# Patient Record
Sex: Female | Born: 1975
Health system: Southern US, Community
[De-identification: ages and names within clinical notes are randomized; demographics above are authoritative.]

## PROBLEM LIST (undated history)

## (undated) DIAGNOSIS — M1990 Unspecified osteoarthritis, unspecified site: Secondary | ICD-10-CM

## (undated) DIAGNOSIS — I10 Essential (primary) hypertension: Secondary | ICD-10-CM

## (undated) DIAGNOSIS — E785 Hyperlipidemia, unspecified: Secondary | ICD-10-CM

## (undated) HISTORY — DX: Hyperlipidemia, unspecified: E78.5

## (undated) HISTORY — DX: Unspecified osteoarthritis, unspecified site: M19.90

## (undated) HISTORY — PX: LAPAROSCOPY: SHX197

## (undated) HISTORY — DX: Essential (primary) hypertension: I10

---

## 2002-09-05 ENCOUNTER — Other Ambulatory Visit: Admission: RE | Admit: 2002-09-05 | Discharge: 2002-09-05 | Payer: Self-pay | Admitting: Family Medicine

## 2002-12-11 ENCOUNTER — Other Ambulatory Visit: Admission: RE | Admit: 2002-12-11 | Discharge: 2002-12-11 | Payer: Self-pay | Admitting: Family Medicine

## 2003-06-26 ENCOUNTER — Other Ambulatory Visit: Admission: RE | Admit: 2003-06-26 | Discharge: 2003-06-26 | Payer: Self-pay | Admitting: Gastroenterology

## 2004-11-18 ENCOUNTER — Other Ambulatory Visit: Admission: RE | Admit: 2004-11-18 | Discharge: 2004-11-18 | Payer: Self-pay | Admitting: Family Medicine

## 2005-06-21 ENCOUNTER — Ambulatory Visit (HOSPITAL_BASED_OUTPATIENT_CLINIC_OR_DEPARTMENT_OTHER): Admission: RE | Admit: 2005-06-21 | Discharge: 2005-06-21 | Payer: Self-pay | Admitting: Gynecology

## 2005-06-30 HISTORY — PX: LAPAROSCOPY: SHX197

## 2005-09-22 ENCOUNTER — Other Ambulatory Visit: Admission: RE | Admit: 2005-09-22 | Discharge: 2005-09-22 | Payer: Self-pay | Admitting: Gynecology

## 2006-04-21 ENCOUNTER — Inpatient Hospital Stay (HOSPITAL_COMMUNITY): Admission: AD | Admit: 2006-04-21 | Discharge: 2006-04-25 | Payer: Self-pay | Admitting: Gynecology

## 2006-04-22 ENCOUNTER — Encounter (INDEPENDENT_AMBULATORY_CARE_PROVIDER_SITE_OTHER): Payer: Self-pay | Admitting: Specialist

## 2006-06-02 ENCOUNTER — Other Ambulatory Visit: Admission: RE | Admit: 2006-06-02 | Discharge: 2006-06-02 | Payer: Self-pay | Admitting: Gynecology

## 2007-10-25 ENCOUNTER — Other Ambulatory Visit: Admission: RE | Admit: 2007-10-25 | Discharge: 2007-10-25 | Payer: Self-pay | Admitting: Gynecology

## 2008-05-16 ENCOUNTER — Ambulatory Visit: Payer: Self-pay | Admitting: Gynecology

## 2008-05-16 ENCOUNTER — Encounter: Payer: Self-pay | Admitting: Gynecology

## 2008-10-31 ENCOUNTER — Other Ambulatory Visit: Admission: RE | Admit: 2008-10-31 | Discharge: 2008-10-31 | Payer: Self-pay | Admitting: Gynecology

## 2008-10-31 ENCOUNTER — Encounter: Payer: Self-pay | Admitting: Gynecology

## 2008-10-31 ENCOUNTER — Ambulatory Visit: Payer: Self-pay | Admitting: Gynecology

## 2008-11-11 ENCOUNTER — Ambulatory Visit: Payer: Self-pay | Admitting: Gynecology

## 2009-07-22 ENCOUNTER — Ambulatory Visit: Payer: Self-pay | Admitting: Gynecology

## 2009-07-29 ENCOUNTER — Ambulatory Visit: Payer: Self-pay | Admitting: Gynecology

## 2009-08-08 ENCOUNTER — Ambulatory Visit: Payer: Self-pay | Admitting: Gynecology

## 2009-11-25 ENCOUNTER — Ambulatory Visit (HOSPITAL_COMMUNITY): Admission: RE | Admit: 2009-11-25 | Discharge: 2009-11-25 | Payer: Self-pay | Admitting: Obstetrics & Gynecology

## 2010-03-20 ENCOUNTER — Inpatient Hospital Stay (HOSPITAL_COMMUNITY): Admission: AD | Admit: 2010-03-20 | Discharge: 2010-03-23 | Payer: Self-pay | Admitting: Obstetrics & Gynecology

## 2010-03-20 ENCOUNTER — Encounter (INDEPENDENT_AMBULATORY_CARE_PROVIDER_SITE_OTHER): Payer: Self-pay | Admitting: Obstetrics & Gynecology

## 2010-11-14 LAB — MRSA CULTURE

## 2010-11-14 LAB — CBC
HCT: 35.6 % — ABNORMAL LOW (ref 36.0–46.0)
Hemoglobin: 10.5 g/dL — ABNORMAL LOW (ref 12.0–15.0)
MCH: 29.8 pg (ref 26.0–34.0)
MCH: 30.3 pg (ref 26.0–34.0)
MCHC: 34.2 g/dL (ref 30.0–36.0)
MCHC: 34.2 g/dL (ref 30.0–36.0)
MCV: 88.6 fL (ref 78.0–100.0)
MCV: 88.8 fL (ref 78.0–100.0)
MCV: 88.8 fL (ref 78.0–100.0)
Platelets: 189 10*3/uL (ref 150–400)
Platelets: 192 10*3/uL (ref 150–400)
RDW: 13.8 % (ref 11.5–15.5)
RDW: 14 % (ref 11.5–15.5)
WBC: 9.7 10*3/uL (ref 4.0–10.5)

## 2010-11-14 LAB — SURGICAL PCR SCREEN: Staphylococcus aureus: INVALID — AB

## 2010-11-14 LAB — RPR
RPR Ser Ql: NONREACTIVE
RPR Ser Ql: NONREACTIVE

## 2011-01-15 NOTE — Op Note (Signed)
NAME:  Pamela Dawson, Pamela Dawson                 ACCOUNT NO.:  [id number]   MEDICAL RECORD NO.:  [id number]          PATIENT TYPE:  AMB   LOCATION:  [facility]                         FACILITY:  [facility]   PHYSICIAN:  Timothy P. Fontaine, M.D.DATE OF BIRTH:  1975/09/16   DATE OF PROCEDURE:  06/21/2005  DATE OF DISCHARGE:  06/21/2005                                 OPERATIVE REPORT   PREOPERATIVE DIAGNOSIS:  Pelvic pain.   POSTOPERATIVE DIAGNOSIS:  Pelvic pain.   PROCEDURE:  Diagnostic laparoscopy with chromopertubation.   SURGEON:  Timothy P. Fontaine, M.D.   ANESTHETIC:  General.   ESTIMATED BLOOD LOSS:  Minimal.   COMPLICATIONS:  None.   SPECIMEN:  None.   FINDINGS:  Anterior cul-de-sac normal, posterior cul-de-sac normal, uterus  normal size, shape and contour. Right and left fallopian tubes normal  length, caliber fimbriated ends, positive fill and spill of dye. Right and  left ovaries grossly normal, free and mobile. No evidence of endometriosis  or pelvic adhesive disease. Upper abdominal exam is normal. Appendix not  visualized due to omental obscuring, liver is smooth, no abnormalities or  perihepatic adhesions. Gallbladder grossly normal to limited inspection.   PROCEDURE:  The patient was taken to the operating room, underwent general  anesthesia and was placed in low dorsal lithotomy position, received an  abdominal perineal vaginal preparation with Betadine solution, bladder  emptied with in-and-out Foley catheterization. EUA performed and a Cohen  cannula was placed on the cervix with a single-tooth tenaculum  stabilization. The patient was draped in the usual fashion. A vertical  infraumbilical incision was made. The Veress needle was placed without  difficulty, its position verified with water. Approximately 2.5 liters of  carbon dioxide gas was infused without difficulty and subsequently the 10 mm  laparoscopic trocar was placed without difficulty, its position verified  visually. A 5 mm suprapubic port was then placed under direct visualization  after transillumination for the vessels without difficulty and examination  of the pelvic organs and upper abdominal exam was carried out with findings  noted above. The thigh was then injected transcervically, there was positive  fill and spill bilaterally. Subsequently the gas was allowed to escape, the  suprapubic port removed, adequate hemostasis visualized at the port site.  The infraumbilical port was then backed out under direct visualization  without showing adequate hemostasis and no evidence of hernia formation. The  infraumbilical port was then closed using #0 Vicryl suture in an interrupted  subcutaneous fascial stitch. Both skin incisions were injected using 0.25%  Marcaine  and both skin incisions closed using Dermabond skin adhesive. The single-  tooth tenaculum and Cohen cannula were removed from the cervix. The patient  placed in the supine position, awakened without difficulty and taken to the  recovery room in good condition having tolerated the procedure well.      Timothy P. Fontaine, M.D.  Electronically Signed     TPF/MEDQ  D:  06/23/2005  T:  06/23/2005  Job:  [id number]

## 2011-01-15 NOTE — Discharge Summary (Signed)
NAME:  Pamela Dawson, Pamela Dawson                 ACCOUNT NO.:  [id number]   MEDICAL RECORD NO.:  [id number]          PATIENT TYPE:  INP   LOCATION:  9103                          FACILITY:  [facility]   PHYSICIAN:  Timothy P. Fontaine, M.D.DATE OF BIRTH:  December 13, 1975   DATE OF ADMISSION:  04/21/2006  DATE OF DISCHARGE:  04/25/2006                                 DISCHARGE SUMMARY   DISCHARGE DIAGNOSES:  1. Pregnancy at term.  2. Pregnancy-induced hypertension.  3. Nonreassuring fetal tracing.   PROCEDURE:  Primary low transverse cervical cesarean section April 22, 2006, Dr. Colin Broach.   HOSPITAL COURSE:  A 35 year old G1 P0 female admitted for induction due to  [facility] term pregnancy.  The patient underwent Cervidil induction in the p.m.  with Pitocin in the a.m. and was noted to have thick meconium upon rupture  of membranes and was placed on an amnio infusion.  The patient was continued  on her Pitocin induction and on reassessment later that day, was found to  have no change in her cervical exam noting an unfavorable station of -3 and  with a blunted fetal tracing showing no accelerations, although noting good  beat-to-beat and no decelerations.  The situation was reviewed with the  patient and her husband and given the unlikelihood of vaginal delivery and  even the most optimal circumstances not for a number of hours now with  blunted fetal tracing, it felt most prudent to proceed with cesarean  section.  The patient underwent an uncomplicated primary low transverse  cervical cesarean section April 22, 2006, producing a normal female infant,  Apgars 9 and 9, weight 7 pounds 8 ounces.  The patient's postoperative  course was uncomplicated.  She was discharged on postoperative day #3,  ambulating well, tolerating a regular diet with a postoperative hemoglobin  of 10.5, blood type A positive, rubella titer positive.  Her blood pressures  fluctuated, the 2 most recent at 132/88, 115/72.  The  patient received  precautions, instructions, and follow-up.  We will monitor her blood  pressure at present status, as anticipating we will see this normalized over  the next several weeks.  She will be seen in the office in 6 weeks following  delivery and receives a prescription for Tylox #30 1-2 p.o. q. 4-6 hours  p.r.n. pain.      Timothy P. Fontaine, M.D.  Electronically Signed     TPF/MEDQ  D:  04/25/2006  T:  04/25/2006  Job:  [id number]

## 2011-01-15 NOTE — H&P (Signed)
NAME:  OAKLEY, ORBAN                 ACCOUNT NO.:  [id number]   MEDICAL RECORD NO.:  [id number]          PATIENT TYPE:  AMB   LOCATION:  [facility]                         FACILITY:  [facility]   PHYSICIAN:  Timothy P. Fontaine, M.D.DATE OF BIRTH:  23-Nov-1975   DATE OF ADMISSION:  06/21/2005  DATE OF DISCHARGE:                                HISTORY & PHYSICAL   CHIEF COMPLAINT:  Pelvic pain.   HISTORY OF PRESENT ILLNESS:  A 35 year old, G0 female with a history of  lower abdominopelvic pain. The patient notes bloating, sharp, stabbing to  crampy type pain that comes and goes getting worse. Outpatient evaluation  includes a normal exam with ultrasound showing a normal appearing uterus,  endometrial cavity, right to left ovaries. GC chlamydia screen were done and  normal. The patient and her husband have been attempting pregnancy over the  past year without success and she is being admitted at this time for  diagnostic laser laparoscopy chromopertubation.   PAST MEDICAL HISTORY:  None.   PAST SURGICAL HISTORY:  None.   ALLERGIES:  DEPAKOTE.   CURRENT MEDICATIONS:  Vitamins.   REVIEW OF SYSTEMS:  Noncontributory.   SOCIAL HISTORY:  Noncontributory.   FAMILY HISTORY:  Noncontributory.   PHYSICAL EXAM:  VITAL SIGNS:  Afebrile. Vital signs are stable.  HEENT:  Normal.  LUNGS:  Clear.  CARDIAC:  Regular rate. No rubs, murmurs or gallops.  ABDOMEN:  Benign.  PELVIC:  External, BUS, vagina normal. Cervix normal. Uterus normal size,  midline, and mobile. Adnexa without masses or tenderness.   ASSESSMENT:  A 35 year old, G0  with increasing pelvic pain whose been  attempting pregnancy over the past year without success. Evaluation includes  normal exam, normal ultrasound, negative GC chlamydia screen, a normal TSH,  FSH and prolactin. The patient is admitted at this time for laser  laparoscopy and chromopertubation. I reviewed with the patient and her  husband the expected intraoperative,  postoperative courses with review of  instrumentation, insufflation, trocar placement multiple port sites, sharp  blunt dissection, electrocautery and the use of laser. The potential  pathologic findings were reviewed to include a normal pelvis, adhesive  disease, endometriosis was all discussed with her. She understands I will  try to eradicate disease as I feel safe to do at that time and if no disease  is encountered, then obviously we will terminate the procedure at that time.  No guarantees as far as pain relief or pregnancy following the procedure  were made and the patient understands this. The acute intraoperative,  postoperative risks were discussed to include the risk of infection both  internal requiring prolonged antibiotics as well as incisional requiring  opening and draining of incisions. The risks of inadvertent injury to major  vessels leading to hemorrhage necessitating transfusion and the risks of  transfusion including transfusion reaction, hepatitis, HIV, mad cow disease  and other unknown entities was all discussed, understood, and accepted. The  risks of inadvertent injury to internal organs including bowel, bladder,  ureters, vessels and nerves necessitating major exploratory reparative  surgeries including future reparative surgeries including  ostomy formation  was all discussed, understood and accepted. The issues of immediate  recognition versus delayed recognition was also discussed and the  postoperative expected to signs and symptoms reviewed to alert her to call  me with any issues. The patient's questions were answered to her  satisfaction and she is ready to proceed with surgery.      Timothy P. Fontaine, M.D.  Electronically Signed     TPF/MEDQ  D:  06/15/2005  T:  06/15/2005  Job:  [id number]

## 2011-01-15 NOTE — Op Note (Signed)
NAME:  Pamela Dawson, Pamela Dawson                 ACCOUNT NO.:  [id number]   MEDICAL RECORD NO.:  [id number]          PATIENT TYPE:  INP   LOCATION:  9103                          FACILITY:  [facility]   PHYSICIAN:  Timothy P. Fontaine, M.D.DATE OF BIRTH:  06/16/1976   DATE OF PROCEDURE:  04/22/2006  DATE OF DISCHARGE:                                 OPERATIVE REPORT   PREOPERATIVE DIAGNOSES:  1. Pregnancy at term.  2. Nonreassuring fetal heart rate tracing   POSTOPERATIVE DIAGNOSIS:  1. Pregnancy at term.  2. Nonreassuring fetal heart rate tracing   PROCEDURE:  Primary low transverse cervical cesarean section.   SURGEON:  Timothy P. Fontaine, M.D.   ASSISTANT:  Scrub technician.   ANESTHESIA:  Epidural.   ESTIMATED BLOOD LOSS:  Less than 500 mL.   COMPLICATIONS:  None.   SPECIMEN:  1. Samples of cord blood.  2. Cord blood banking.  3. Placenta and umbilical cord.   FINDINGS:  At 56, normal female, Apgars 9/9, weight 7 pounds 8 ounces,  nuchal cord x1.  Pelvic anatomy noted to be normal.   PROCEDURE:  The patient was taken to the operating room, had her epidural  catheter dosed, and was placed in the left tilt supine position, received  abdominal preparation with Betadine solution.  The patient was draped in the  usual fashion having had a Foley catheter placed on labor and delivery.  After assuring adequate anesthesia, the abdomen was sharply entered through  a Pfannenstiel incision achieving adequate hemostasis at all levels.  The  bladder flap was sharply and bluntly developed without difficulty.  The  uterus was sharply entered in the lower uterine segment and bluntly extended  laterally.  The membranes were ruptured, the fluid noted to be light  meconium stained. The infant's head was delivered through the incision.  A  DeLee suction was used to clear the nares and oropharynx. Subsequently, the  bulb suction was used.  A nuchal cord x1 was reduced.  The rest of the  infant was  delivered, the cord doubly clamped and cut, and the infant handed  to pediatrics in attendance.  Samples of cord blood were obtained.  The  placenta was then spontaneously extruded and handed off for cord blood  banking collection.  The placenta was then spontaneously extruded and sent  to pathology.  The uterus was exteriorized, endometrial cavity explored with  a sponge to remove all placental and membrane fragments.  The patient  received antibiotic prophylaxis at this time.  The uterine incision was then  closed in two layers using 0 Vicryl suture, first in a running interlocking  stitch followed by an imbricating stitch.  The uterus was then returned to  the abdomen which was copiously irrigated.  Adequate hemostasis was  visualized.  The anterior fascia reapproximated using 0 Vicryl suture in a  running stitch.  The subcutaneous tissues were irrigated.  Adequate  hemostasis was achieved with electrocautery.  The skin was reapproximated  using 4-0 Vicryl in a running subcuticular stitch.  Benzoin and Steri-Strips  applied.  Sterile dressing applied.  The  patient was taken to the recovery  room in good condition having tolerated the procedure well.      Timothy P. Fontaine, M.D.  Electronically Signed     TPF/MEDQ  D:  04/22/2006  T:  04/23/2006  Job:  [id number]

## 2011-01-15 NOTE — Consult Note (Signed)
NAME:  Pamela Dawson, Pamela Dawson                 ACCOUNT NO.:  [id number]   MEDICAL RECORD NO.:  [id number]          PATIENT TYPE:  INP   LOCATION:  9167                          FACILITY:  [facility]   PHYSICIAN:  Timothy P. Fontaine, M.D.DATE OF BIRTH:  [birth year]   DATE OF CONSULTATION:  04/22/2006  DATE OF DISCHARGE:                                   CONSULTATION   In to evaluate patient in labor.  The patient had thick meconium on rupture  of membranes earlier this morning by Dr. Farrel Gobble.  Had an IUPC placed, had  an amnioinfusion, then was begun on Pitocin augmentation.  Subsequently the  Pitocin had been discontinued as she was in active labor spontaneously.  Subsequently had epidural placed due to pain and currently is starting to  feel more pain, requesting redose of her epidural.  Her external monitor  shows regular contractions every 1-3 minutes, and her fetal tracing shows a  blunted, reassuring tracing with beat-to-beat, no clear accelerations, no  decelerations.  Baseline is 130s-140s.  On pelvic, cervix is 2 cm, 40%  effaced, -3 station.  No evidence of gross meconium on vaginal glove  examination.  I discussed with the patient no cervix change since this  morning.  Given the unfavorable station, very high, unengaged, and a blunted  fetal tracing, I feel the most prudent choice is to move to cesarean  section.  No confirmed evidence of fetal distress but doubting that this  patient will progress to a spontaneous vaginal delivery.  I do not think  that proceeding with continued attempts at vaginal delivery and prolonged  observation is prudent.  I reviewed what is involved with cesarean section,  intraoperative, postoperative courses, and the risks, to include infection  both internal requiring prolonged antibiotics and incisional requiring  opening and draining of the incision and closure by secondary intention; the  risks of hemorrhage necessitating transfusion and the risks of transfusion  were reviewed.  The risk of an inadvertent injury to internal organs  including bowel, bladder, ureters, vessels and nerves necessitating major  exploratory or reparative surgeries and future reparative surgeries  including ostomy formation, was all discussed, understood and accepted.  Lastly, the risk of fetal injury, musculoskeletal, neural, scalpel injuries  during the birthing process, was all discussed, understood and accepted.  The patient's questions were answered to her satisfaction and she is ready  to proceed with surgery.      Timothy P. Fontaine, M.D.  Electronically Signed     TPF/MEDQ  D:  04/22/2006  T:  04/22/2006  Job:  [id number]

## 2011-01-15 NOTE — H&P (Signed)
NAME:  Pamela Dawson, Pamela Dawson                 ACCOUNT NO.:  [id number]   MEDICAL RECORD NO.:  [id number]          PATIENT TYPE:  MAT   LOCATION:  [facility]                          FACILITY:  [facility]   PHYSICIAN:  Timothy P. Fontaine, M.D.DATE OF BIRTH:  11/26/1975   DATE OF ADMISSION:  04/21/2006  DATE OF DISCHARGE:                                HISTORY & PHYSICAL   CHIEF COMPLAINT:  1. Pregnancy at term.  2. Pregnancy-induced hypertension.   HISTORY OF PRESENT ILLNESS:  A 35 year old G1, P0, female at 71 weeks'  gestation, admitted for induction due to favorable cervix and PIH.  The  patient has been followed with antepartum to include NSTs and AFIs, all of  which have been normal.  Her registration blood pressure was 118/76.  Her  most recent office visit blood pressure is 130/90, negative proteinuria.  The risks, benefits, indications and alternatives for induction were  reviewed with the patient and the husband, and they agreed.  For the  remainder of her history, see her [place].   PHYSICAL EXAMINATION:  HEENT:  Normal.  LUNGS:  Clear.  CARDIAC:  Regular rate.  No rubs, murmurs or gallops.  ABDOMEN:  Gravid, vertex fetus, consistent with term, reactive NST.  PELVIC:  Cervix loose fingertip, 50%, -2 station, vertex presentation.   ASSESSMENT:  A 35 year old G1, P0, female at 43 weeks' gestation, mild  pregnancy-induced hypertension at term.  Cervix favorable for induction.  Will plan on Cervidil in the p.m., Pitocin in the a.m.  She is beta strep-  negative.  The plan was discussed with the patient and her husband, both of  whom agreed.      Timothy P. Fontaine, M.D.  Electronically Signed     TPF/MEDQ  D:  04/20/2006  T:  04/20/2006  Job:  [id number]

## 2011-12-16 ENCOUNTER — Emergency Department (HOSPITAL_BASED_OUTPATIENT_CLINIC_OR_DEPARTMENT_OTHER)
Admission: EM | Admit: 2011-12-16 | Discharge: 2011-12-16 | Disposition: A | Discharge status: Home / Self Care | Payer: Worker's Compensation | Attending: Emergency Medicine | Admitting: Emergency Medicine

## 2011-12-16 ENCOUNTER — Emergency Department (INDEPENDENT_AMBULATORY_CARE_PROVIDER_SITE_OTHER): Payer: Worker's Compensation

## 2011-12-16 ENCOUNTER — Encounter (HOSPITAL_BASED_OUTPATIENT_CLINIC_OR_DEPARTMENT_OTHER): Payer: Self-pay | Admitting: Emergency Medicine

## 2011-12-16 DIAGNOSIS — M25476 Effusion, unspecified foot: Secondary | ICD-10-CM | POA: Insufficient documentation

## 2011-12-16 DIAGNOSIS — M25473 Effusion, unspecified ankle: Secondary | ICD-10-CM | POA: Insufficient documentation

## 2011-12-16 DIAGNOSIS — R209 Unspecified disturbances of skin sensation: Secondary | ICD-10-CM | POA: Insufficient documentation

## 2011-12-16 DIAGNOSIS — X500XXA Overexertion from strenuous movement or load, initial encounter: Secondary | ICD-10-CM

## 2011-12-16 DIAGNOSIS — S93409A Sprain of unspecified ligament of unspecified ankle, initial encounter: Secondary | ICD-10-CM | POA: Insufficient documentation

## 2011-12-16 DIAGNOSIS — M25579 Pain in unspecified ankle and joints of unspecified foot: Secondary | ICD-10-CM | POA: Insufficient documentation

## 2011-12-16 DIAGNOSIS — S93402A Sprain of unspecified ligament of left ankle, initial encounter: Secondary | ICD-10-CM

## 2011-12-16 DIAGNOSIS — W010XXA Fall on same level from slipping, tripping and stumbling without subsequent striking against object, initial encounter: Secondary | ICD-10-CM | POA: Insufficient documentation

## 2011-12-16 MED ORDER — OXYCODONE-ACETAMINOPHEN 5-325 MG PO TABS
1.0000 | ORAL_TABLET | Freq: Once | ORAL | Status: AC
  Administered 2011-12-16: 1 via ORAL
  Filled 2011-12-16: qty 1

## 2011-12-16 MED ORDER — OXYCODONE-ACETAMINOPHEN 5-325 MG PO TABS: 1.0000 | ORAL_TABLET | ORAL | Status: AC | PRN

## 2011-12-16 NOTE — ED Notes (Signed)
Pt was at work - running on grassy surface.  Stepped into hole, twisted left ankle and fell.  Ice applied in triage. CMS intact.

## 2011-12-16 NOTE — ED Notes (Signed)
MD at bedside. 

## 2011-12-16 NOTE — Discharge Instructions (Signed)
Ankle Sprain An ankle sprain is an injury to the strong, fibrous tissues (ligaments) that hold the bones of your ankle joint together.  CAUSES Ankle sprain usually is caused by a fall or by twisting your ankle. People who participate in sports are more prone to these types of injuries.  SYMPTOMS  Symptoms of ankle sprain include:  Pain in your ankle. The pain may be present at rest or only when you are trying to stand or walk.   Swelling.   Bruising. Bruising may develop immediately or within 1 to 2 days after your injury.   Difficulty standing or walking.  DIAGNOSIS  Your caregiver will ask you details about your injury and perform a physical exam of your ankle to determine if you have an ankle sprain. During the physical exam, your caregiver will press and squeeze specific areas of your foot and ankle. Your caregiver will try to move your ankle in certain ways. An X-ray exam may be done to be sure a bone was not broken or a ligament did not separate from one of the bones in your ankle (avulsion).  TREATMENT  Certain types of braces can help stabilize your ankle. Your caregiver can make a recommendation for this. Your caregiver may recommend the use of medication for pain. If your sprain is severe, your caregiver may refer you to a surgeon who helps to restore function to parts of your skeletal system (orthopedist) or a physical therapist. HOME CARE INSTRUCTIONS  Apply ice to your injury for 1 to 2 days or as directed by your caregiver. Applying ice helps to reduce inflammation and pain.  Put ice in a plastic bag.   Place a towel between your skin and the bag.   Leave the ice on for 15 to 20 minutes at a time, every 2 hours while you are awake.   Take over-the-counter or prescription medicines for pain, discomfort, or fever only as directed by your caregiver.   Keep your injured leg elevated, when possible, to lessen swelling.   If your caregiver recommends crutches, use them as  instructed. Gradually, put weight on the affected ankle. Continue to use crutches or a cane until you can walk without feeling pain in your ankle.   If you have a plaster splint, wear the splint as directed by your caregiver. Do not rest it on anything harder than a pillow the first 24 hours. Do not put weight on it. Do not get it wet. You may take it off to take a shower or bath.   You may have been given an elastic bandage to wear around your ankle to provide support. If the elastic bandage is too tight (you have numbness or tingling in your foot or your foot becomes cold and blue), adjust the bandage to make it comfortable.   If you have an air splint, you may blow more air into it or let air out to make it more comfortable. You may take your splint off at night and before taking a shower or bath.   Wiggle your toes in the splint several times per day if you are able.  SEEK MEDICAL CARE IF:   You have an increase in bruising, swelling, or pain.   Your toes feel cold.   Pain relief is not achieved with medication.  SEEK IMMEDIATE MEDICAL CARE IF: Your toes are numb or blue or you have severe pain. MAKE SURE YOU:   Understand these instructions.   Will watch your condition.     Will get help right away if you are not doing well or get worse.  Document Released: 08/16/2005 Document Revised: 08/05/2011 Document Reviewed: 03/20/2008 [employer] Patient Information 2012 [employer]. 

## 2011-12-16 NOTE — ED Provider Notes (Signed)
History     CSN: [id number]  Arrival date & time 12/16/11  4098   First MD Initiated Contact with Patient 12/16/11 1000      Chief Complaint  Patient presents with  . Ankle Pain     Patient is a 36 y.o. female presenting with ankle pain. The history is provided by the patient.  Ankle Pain  The incident occurred 1 to 2 hours ago. The incident occurred at work. The injury mechanism was a fall. The pain is present in the left ankle. The pain is moderate. The pain has been constant since onset. Associated symptoms include numbness and inability to bear weight. Pertinent negatives include no loss of motion and no muscle weakness. The symptoms are aggravated by activity, bearing weight and palpation. She has tried rest for the symptoms. The treatment provided mild relief.  pt was running in grass, stepped in hole and twisted left ankle, heard a "crack" No head trauma No other injuries reported   PMH - none  Past Surgical History  Procedure Date  . Cesarean section   . Laparoscopy     No family history on file.  History  Substance Use Topics  . Smoking status: Never Smoker   . Smokeless tobacco: Not on file  . Alcohol Use: No    OB History    Grav Para Term Preterm Abortions TAB SAB Ect Mult Living                  Review of Systems  Musculoskeletal: Positive for joint swelling.  Neurological: Positive for numbness.    Allergies  Depakote  Home Medications   Current Outpatient Rx  Name Route Sig Dispense Refill  . OXYCODONE-ACETAMINOPHEN 5-325 MG PO TABS Oral Take 1 tablet by mouth every 4 (four) hours as needed for pain. 15 tablet 0    BP 121/81  Pulse 61  Temp(Src) 98.2 F (36.8 C) (Oral)  Resp 16  Ht 5\' 4"  (1.626 m)  Wt 185 lb (83.915 kg)  BMI 31.76 kg/m2  SpO2 100%  LMP 11/22/2011  Physical Exam CONSTITUTIONAL: Well developed/well nourished HEAD AND FACE: Normocephalic/atraumatic EYES: EOMI/PERRL ENMT: Mucous membranes moist NECK: supple no  meningeal signs SPINE:entire spine nontender CV: S1/S2 noted, no murmurs/rubs/gallops noted LUNGS: Lungs are clear to auscultation bilaterally, no apparent distress ABDOMEN: soft, nontender, no rebound or guarding GU:no cva tenderness NEURO: Pt is awake/alert, moves all extremitiesx4 Distal motor intact on left foot EXTREMITIES: pulses normal, full ROM Tenderness to left lateral/medial malleolus but no deformity.  Distal foot nontender to palpation.  She is able to move all toes.  Left achilles intact.  No left prox fib tenderness, full ROM of left without difficulty Able to range left hip without difficulty SKIN: warm, color normal PSYCH: no abnormalities of mood noted  ED Course  Procedures   Labs Reviewed - No data to display Dg Ankle Complete Left  12/16/2011  *RADIOLOGY REPORT*  Clinical Data: Turned ankle running, pain, cannot dorsiflex  LEFT ANKLE COMPLETE - 3+ VIEW  Comparison: Left foot radiographs 06/18/2009  Findings: Osseous mineralization normal. Ankle mortise intact. No acute fracture, dislocation or bone destruction.  IMPRESSION: No acute osseous abnormalities.  Original Report Authenticated By: Lollie Marrow, M.D.     1. Left ankle sprain       MDM  Nursing notes reviewed and considered in documentation xrays reviewed and considered  Will advise NWB, air splint and f/u in 5 days Restrictions for work given  The patient  appears reasonably screened and/or stabilized for discharge and I doubt any other medical condition or other [facility] requiring further screening, evaluation, or treatment in the ED at this time prior to discharge.         Joya Gaskins, MD 12/16/11 1122

## 2016-08-16 ENCOUNTER — Other Ambulatory Visit (HOSPITAL_BASED_OUTPATIENT_CLINIC_OR_DEPARTMENT_OTHER): Payer: Self-pay | Admitting: Family Medicine

## 2016-08-16 ENCOUNTER — Ambulatory Visit (HOSPITAL_BASED_OUTPATIENT_CLINIC_OR_DEPARTMENT_OTHER)
Admission: RE | Admit: 2016-08-16 | Discharge: 2016-08-16 | Disposition: A | Discharge status: Home / Self Care | Payer: 59 | Source: Ambulatory Visit | Attending: Family Medicine | Admitting: Family Medicine

## 2016-08-16 DIAGNOSIS — W08XXXA Fall from other furniture, initial encounter: Secondary | ICD-10-CM | POA: Diagnosis not present

## 2016-08-16 DIAGNOSIS — S060X9A Concussion with loss of consciousness of unspecified duration, initial encounter: Secondary | ICD-10-CM | POA: Diagnosis present

## 2016-08-16 DIAGNOSIS — S060X1A Concussion with loss of consciousness of 30 minutes or less, initial encounter: Secondary | ICD-10-CM

## 2016-12-17 DIAGNOSIS — R51 Headache: Secondary | ICD-10-CM | POA: Diagnosis not present

## 2017-01-04 DIAGNOSIS — R51 Headache: Secondary | ICD-10-CM | POA: Diagnosis not present

## 2017-03-22 DIAGNOSIS — H1013 Acute atopic conjunctivitis, bilateral: Secondary | ICD-10-CM | POA: Diagnosis not present

## 2017-03-30 ENCOUNTER — Ambulatory Visit (HOSPITAL_BASED_OUTPATIENT_CLINIC_OR_DEPARTMENT_OTHER)
Admission: RE | Admit: 2017-03-30 | Discharge: 2017-03-30 | Disposition: A | Discharge status: Home / Self Care | Payer: 59 | Source: Ambulatory Visit | Attending: Family Medicine | Admitting: Family Medicine

## 2017-03-30 ENCOUNTER — Ambulatory Visit (INDEPENDENT_AMBULATORY_CARE_PROVIDER_SITE_OTHER): Payer: Self-pay | Admitting: Orthopedic Surgery

## 2017-03-30 ENCOUNTER — Other Ambulatory Visit: Payer: Self-pay | Admitting: Family Medicine

## 2017-03-30 DIAGNOSIS — K439 Ventral hernia without obstruction or gangrene: Secondary | ICD-10-CM | POA: Insufficient documentation

## 2017-03-30 DIAGNOSIS — N136 Pyonephrosis: Secondary | ICD-10-CM | POA: Diagnosis not present

## 2017-03-30 DIAGNOSIS — R109 Unspecified abdominal pain: Secondary | ICD-10-CM

## 2017-03-30 DIAGNOSIS — N200 Calculus of kidney: Secondary | ICD-10-CM

## 2017-03-30 DIAGNOSIS — R1031 Right lower quadrant pain: Secondary | ICD-10-CM | POA: Diagnosis not present

## 2017-03-31 DIAGNOSIS — N200 Calculus of kidney: Secondary | ICD-10-CM | POA: Diagnosis not present

## 2017-08-22 DIAGNOSIS — M25562 Pain in left knee: Secondary | ICD-10-CM | POA: Diagnosis not present

## 2017-08-22 DIAGNOSIS — I10 Essential (primary) hypertension: Secondary | ICD-10-CM | POA: Diagnosis not present

## 2017-09-13 DIAGNOSIS — J0110 Acute frontal sinusitis, unspecified: Secondary | ICD-10-CM | POA: Diagnosis not present

## 2017-09-21 ENCOUNTER — Encounter (INDEPENDENT_AMBULATORY_CARE_PROVIDER_SITE_OTHER): Payer: Self-pay | Admitting: Orthopedic Surgery

## 2017-09-21 ENCOUNTER — Ambulatory Visit (INDEPENDENT_AMBULATORY_CARE_PROVIDER_SITE_OTHER): Payer: 59 | Admitting: Orthopedic Surgery

## 2017-09-21 DIAGNOSIS — M25562 Pain in left knee: Secondary | ICD-10-CM | POA: Diagnosis not present

## 2017-09-24 ENCOUNTER — Encounter (INDEPENDENT_AMBULATORY_CARE_PROVIDER_SITE_OTHER): Payer: Self-pay | Admitting: Orthopedic Surgery

## 2017-09-24 NOTE — Progress Notes (Signed)
   Office Visit Note   Patient: Pamela CoinJulia Rodick           Date of Birth: 12/30/1975           MRN: [id number] Visit Date: 09/21/2017 Requested by: No referring provider defined for this encounter. PCP: System, Pcp Not In  Subjective: Chief Complaint  Patient presents with  . Left Knee - Pain, Injury    HPI: Raynelle FanningJulie is a patient with left knee pain.  She injured her knee at work 09/16/2017 after a fall.  Had radiographs which were negative for fracture.  States she still has some swelling in the knee.  Describes some weakness and giving way.  This was a frontal impact to the left knee.  Been on prednisone for 5 days due to a sinus infection.              ROS: All systems reviewed are negative as they relate to the chief complaint within the history of present illness.  Patient denies  fevers or chills.   Assessment & Plan: Visit Diagnoses:  1. Acute pain of left knee     Plan: Treatment is contusion left knee with no evidence of ligamentous or bony damage.  No effusion in the knee.  I think that should be a self resolved problem.  I do want her to try Duexis 1 a day for the next 9 days and a sample boxes provided..  We could consider an injection in the knee if that does not resolve her symptoms.  Follow-Up Instructions: Return if symptoms worsen or fail to improve.   Orders:  No orders of the defined types were placed in this encounter.  No orders of the defined types were placed in this encounter.     Procedures: No procedures performed   Clinical Data: No additional findings.  Objective: Vital Signs: There were no vitals taken for this visit.  Physical Exam:   Constitutional: Patient appears well-developed HEENT:  Head: Normocephalic Eyes:EOM are normal Neck: Normal range of motion Cardiovascular: Normal rate Pulmonary/chest: Effort normal Neurologic: Patient is alert Skin: Skin is warm Psychiatric: Patient has normal mood and affect    Ortho Exam: Orthopedic  exam demonstrates abrasion over the left knee anteriorly.  Extensor mechanism is intact.  Collateral cruciate ligaments are stable.  No focal joint line tenderness is noted and there is no effusion.  No groin pain with internal/external rotation of the leg.  Specialty Comments:  No specialty comments available.  Imaging: No results found.   PMFS History: There are no active problems to display for this patient.  History reviewed. No pertinent past medical history.  History reviewed. No pertinent family history.  Past Surgical History:  Procedure Laterality Date  . CESAREAN SECTION    . LAPAROSCOPY     Social History   Occupational History  . Not on file  Tobacco Use  . Smoking status: Never Smoker  Substance and Sexual Activity  . Alcohol use: No  . Drug use: No  . Sexual activity: Not on file

## 2018-01-07 IMAGING — CT CT RENAL STONE PROTOCOL
2 of 4 series · 16 of 46 positions shown, 18 images · non-contrast
Comparison: None.

CLINICAL DATA: Right flank pain with microscopic hematuria and
nausea

EXAM:
CT ABDOMEN AND PELVIS WITHOUT CONTRAST
TECHNIQUE: Multidetector CT imaging of the abdomen and pelvis was performed
following the standard protocol without oral or intravenous contrast
material administration.

[Series 2: axial st · axial · 0.98mm/px · z∈[-547,-52]mm · 13 of 109 slices shown, 15 images]
[im 5/109  soft-tissue]
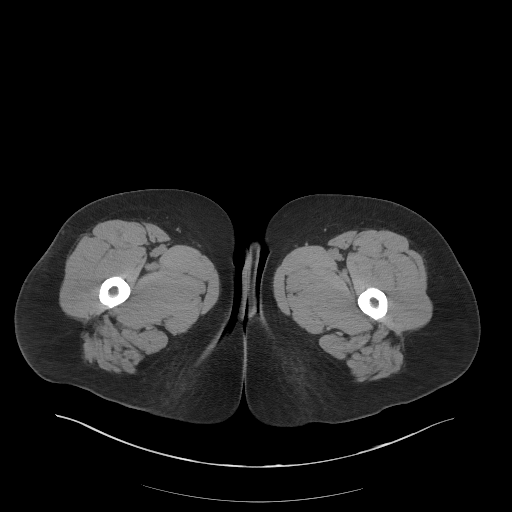
[im 5/109  bone]
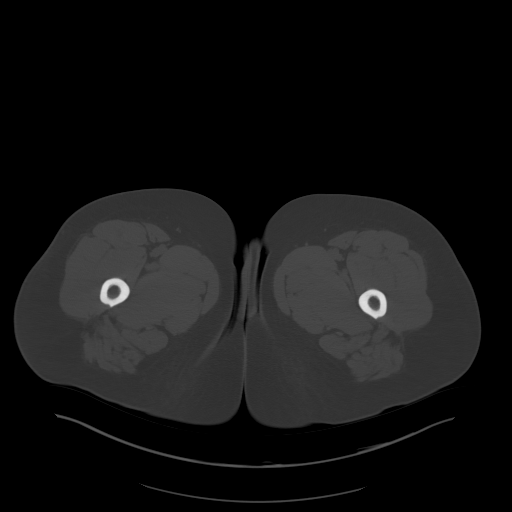
[im 14/109  soft-tissue]
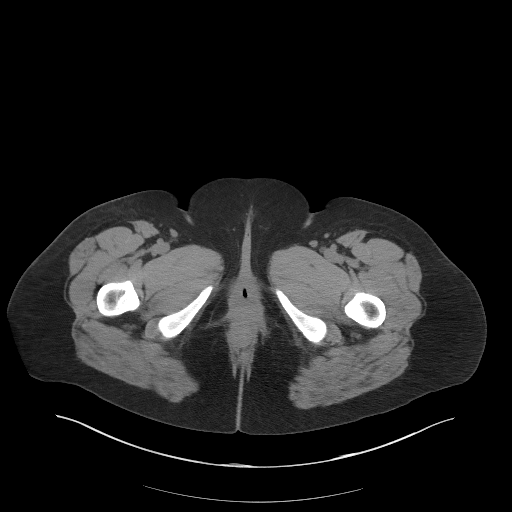
[im 23/109  soft-tissue]
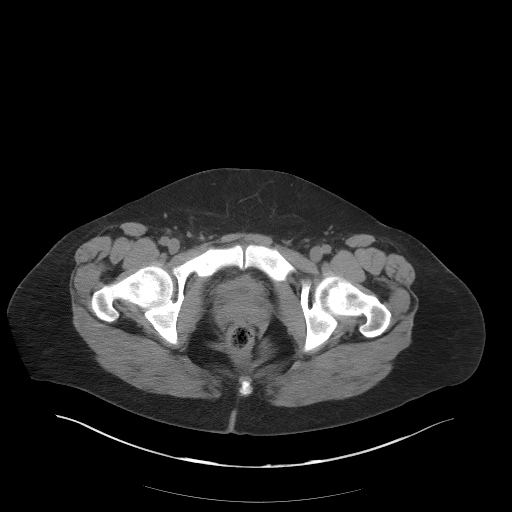
[im 32/109  soft-tissue]
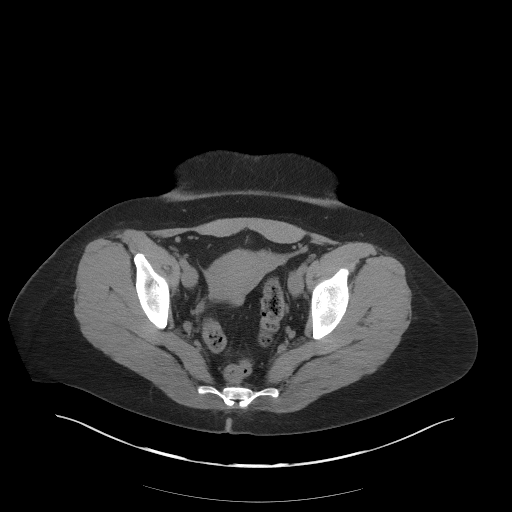
[im 37/109  soft-tissue]
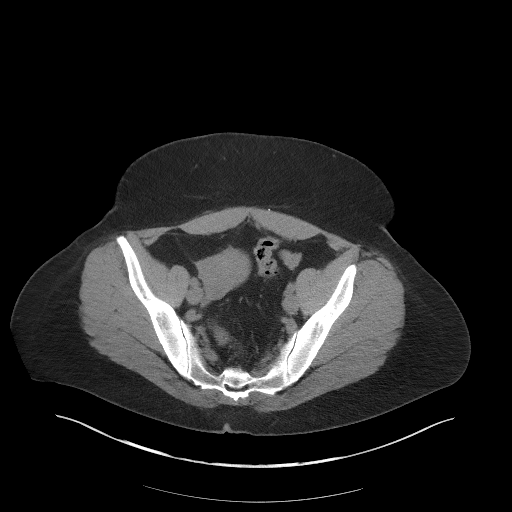
[im 46/109  soft-tissue]
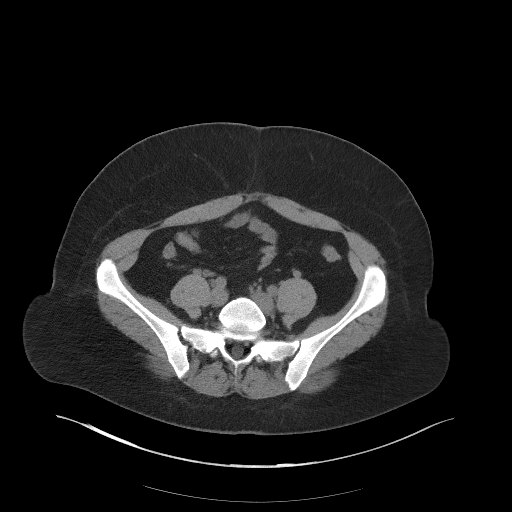
[im 55/109  soft-tissue]
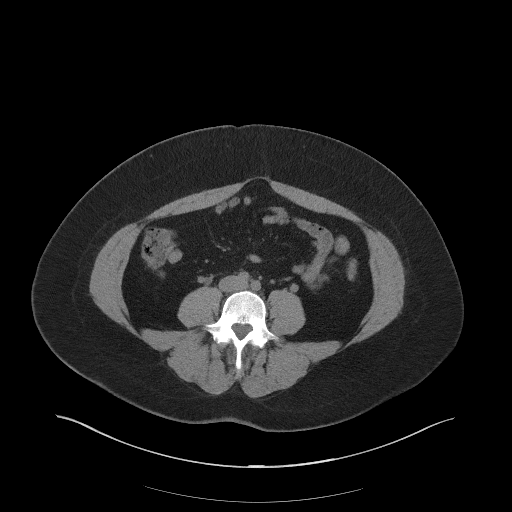
[im 64/109  soft-tissue]
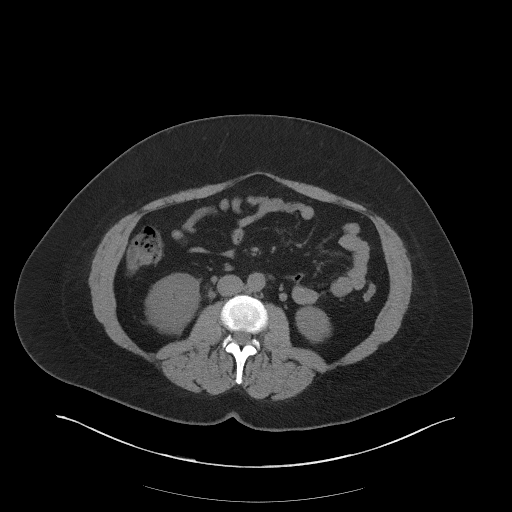
[im 73/109  soft-tissue]
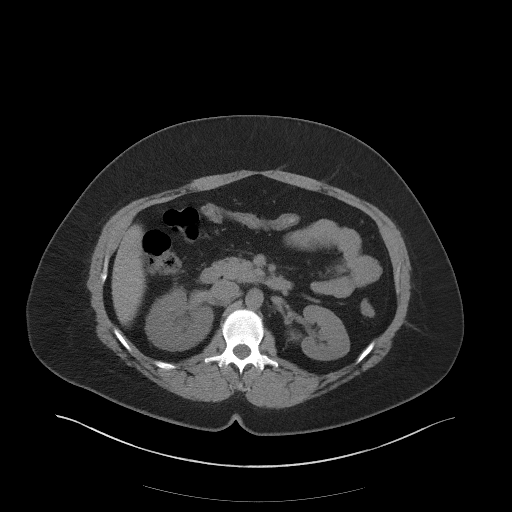
[im 73/109  bone]
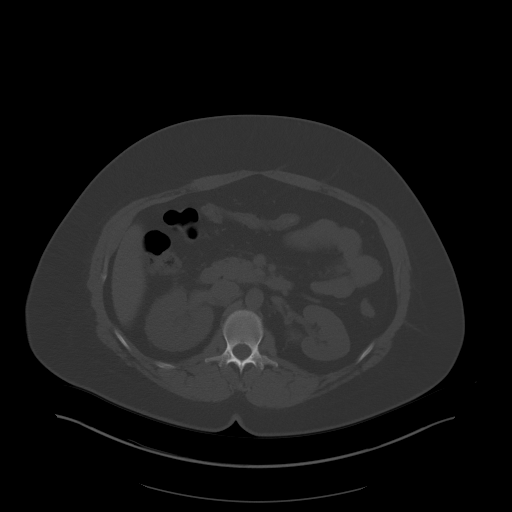
[im 77/109  soft-tissue]
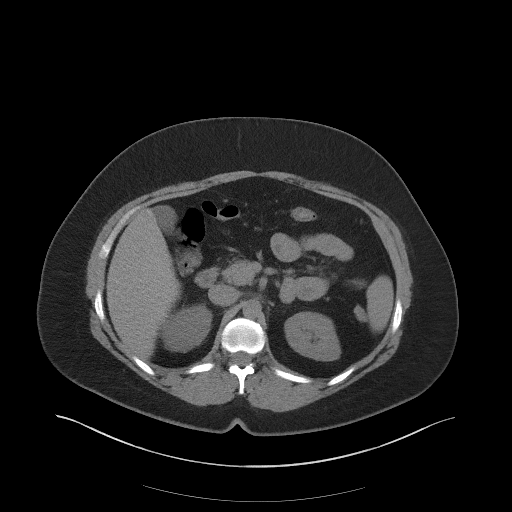
[im 86/109  soft-tissue]
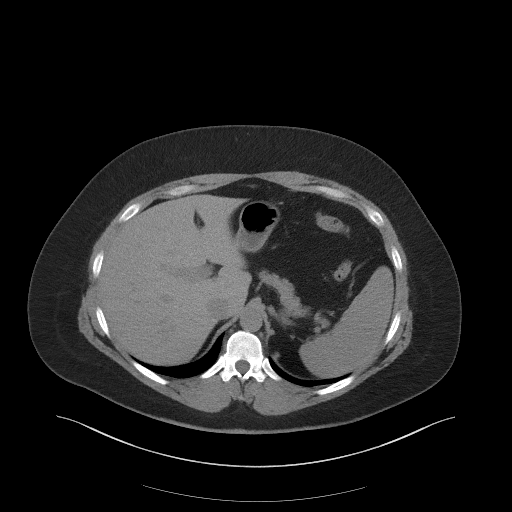
[im 95/109  soft-tissue]
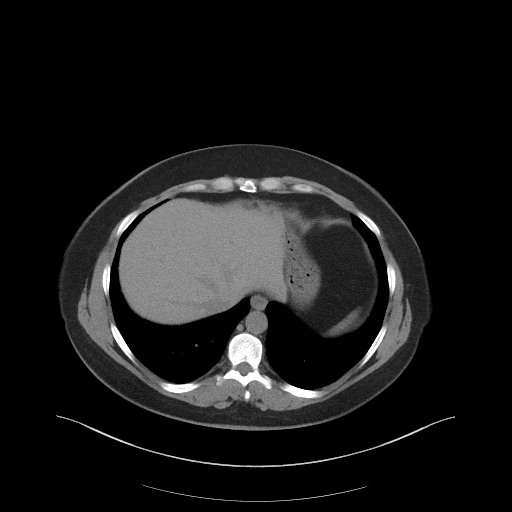
[im 104/109  soft-tissue]
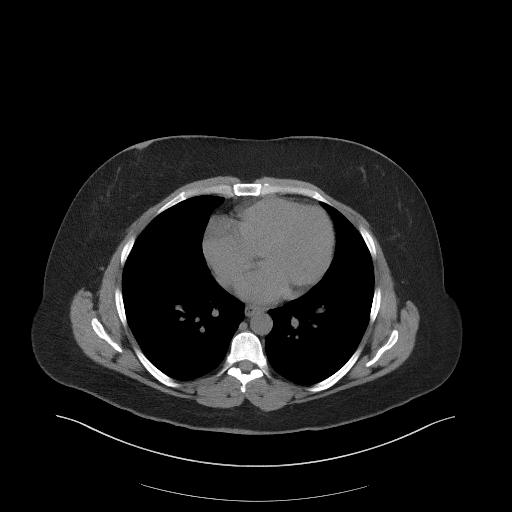

[Series 4: coronal st · coronal · 0.94mm/px · 3 of 112 slices shown]
[im 38/112  soft-tissue]
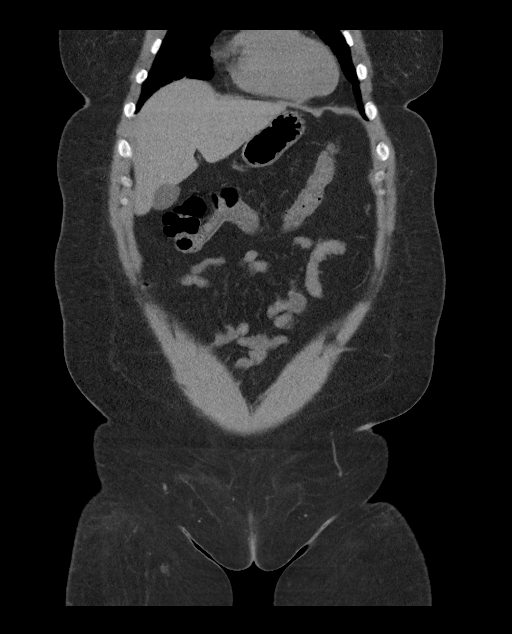
[im 50/112  soft-tissue]
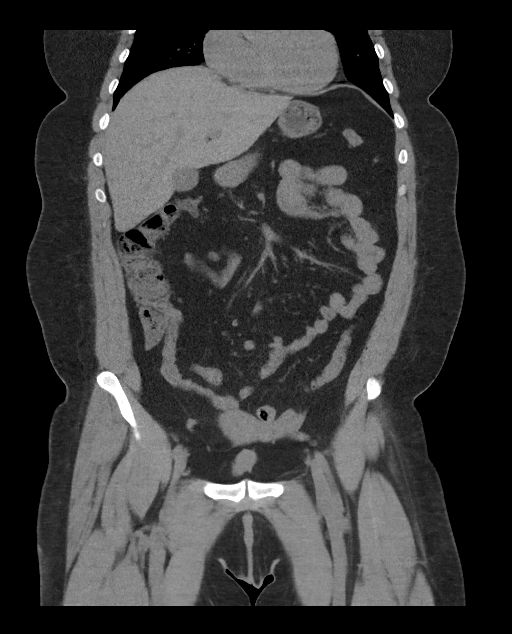
[im 62/112  soft-tissue]
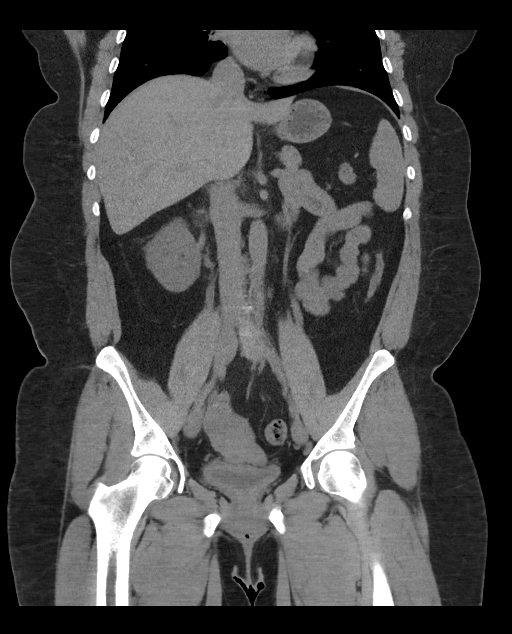

[16 of 46 positions shown; findings below may reference images not displayed]

FINDINGS: Lower chest: There is mild bibasilar lung atelectatic change. No
lung base edema or consolidation.

Hepatobiliary: No focal liver lesions are apparent on this
noncontrast enhanced study. Gallbladder wall is not appreciably
thickened. There is no biliary duct dilatation.

Pancreas: There is no pancreatic mass or inflammatory focus.

Spleen: No splenic lesions are evident.

Adrenals/Urinary Tract: Adrenals appear normal bilaterally. Right
kidney is edematous. There is no renal mass on either side. There is
moderate hydronephrosis on the right. There is no hydronephrosis on
the left. There is a nonobstructing 2 mm calculus in the lower pole
of the left kidney. No intrarenal calculus is seen on the right.
There is a 2 mm calculus at the right ureterovesical junction. No
other ureteral calculi are evident on either side. Urinary bladder
is largely decompressed. Urinary bladder wall does appear mildly
thickened given the degree of residual urine within the bladder.

Stomach/Bowel: There is no appreciable bowel wall or mesenteric
thickening. There is no appreciable bowel obstruction. No free air
or portal venous air.

Vascular/Lymphatic: There is no abdominal aortic aneurysm. No
vascular lesions are evident on this noncontrast enhanced study.
There is no adenopathy in the abdomen or pelvis.

Reproductive: Uterus is anteverted.  No pelvic mass.

Other: Appendix appears normal. No ascites or abscess is evident in
the abdomen or pelvis. There is a minimal ventral hernia containing
only fat.

Musculoskeletal: There are no blastic or lytic bone lesions. There
is no intramuscular or abdominal wall lesion.
IMPRESSION: 1. 2 mm calculus right ureterovesical junction with moderate
right-sided hydronephrosis and ureterectasis. Right kidney is
edematous.

2. There is a degree of urinary bladder wall thickening, felt to
represent a degree of cystitis.

3.  Nonobstructing 2 mm calculus lower pole left kidney.

4.  Minimal ventral hernia containing only fat.

5.  No bowel obstruction.  No abscess.  Appendix appears normal.

## 2018-10-25 ENCOUNTER — Ambulatory Visit (INDEPENDENT_AMBULATORY_CARE_PROVIDER_SITE_OTHER): Payer: 59 | Admitting: Orthopedic Surgery

## 2018-10-25 ENCOUNTER — Encounter (INDEPENDENT_AMBULATORY_CARE_PROVIDER_SITE_OTHER): Payer: Self-pay | Admitting: Orthopedic Surgery

## 2018-10-25 DIAGNOSIS — M7541 Impingement syndrome of right shoulder: Secondary | ICD-10-CM | POA: Diagnosis not present

## 2018-10-25 MED ORDER — ACETAMINOPHEN-CODEINE #3 300-30 MG PO TABS: ORAL_TABLET | ORAL | 0 refills | Status: AC

## 2018-10-25 MED ORDER — IBUPROFEN-FAMOTIDINE 800-26.6 MG PO TABS: ORAL_TABLET | ORAL | 0 refills | Status: DC

## 2018-10-25 MED ORDER — METHOCARBAMOL 500 MG PO TABS: ORAL_TABLET | ORAL | 0 refills | Status: DC

## 2018-10-26 ENCOUNTER — Encounter (INDEPENDENT_AMBULATORY_CARE_PROVIDER_SITE_OTHER): Payer: Self-pay | Admitting: Orthopedic Surgery

## 2018-10-26 NOTE — Progress Notes (Signed)
Office Visit Note   Patient: Pamela Dawson           Date of Birth: 06/25/1976           MRN: [id number] Visit Date: 10/25/2018 Requested by: No referring provider defined for this encounter. PCP: System, Pcp Not In  Subjective: Chief Complaint  Patient presents with  . Right Shoulder - Pain    HPI: Pamela Dawson is a patient with right shoulder pain.  She was cleaning a pond in December doing a lot of lifting and developed some pain at that time.  Then she felt a pop in the right shoulder when she was pitching softball 4 days ago.  She went to urgent care and was prescribed Flexeril and prednisone.  The prednisone is keeping her awake at night.  She reports pain and weakness.  Radiographs were performed and she was told that they were normal.  She has had one episode of diminished sensation affecting digits 4 and 5 in the right hand.  Localizes most of her pain around the infraclavicular region.  She does do an underhand pitching.  Hard for her to sleep at times.  Denies much in the way of shoulder weakness.              ROS: All systems reviewed are negative as they relate to the chief complaint within the history of present illness.  Patient denies  fevers or chills.   Assessment & Plan: Visit Diagnoses:  1. Impingement syndrome of right shoulder     Plan: Impression is right shoulder pain which looks to be potentially more like brachial plexus type strain.  She is having some thoracic outlet symptoms.  Shoulder exam itself is pretty benign.  Particularly in regards to the biceps tendon.  Plan is Tylenol 3 to take at night along with Duexis instead of the Medrol Dosepak.  We will also add Robaxin as a muscle relaxer and just have her stay out from pitching for at least 2 to 3 weeks.  I think this should be a self-limited problem.  If not we could consider further imaging.  Follow-Up Instructions: Return if symptoms worsen or fail to improve.   Orders:  No orders of the defined types were  placed in this encounter.  Meds ordered this encounter  Medications  . acetaminophen-codeine (TYLENOL #3) 300-30 MG tablet    Sig: 1 po q hs prn pain    Dispense:  35 tablet    Refill:  0  . methocarbamol (ROBAXIN) 500 MG tablet    Sig: 1 po q hs prn spasm    Dispense:  30 tablet    Refill:  0  . Ibuprofen-Famotidine (DUEXIS) 800-26.6 MG TABS    Sig: 1 po bid prn    Dispense:  60 tablet    Refill:  0      Procedures: No procedures performed   Clinical Data: No additional findings.  Objective: Vital Signs: There were no vitals taken for this visit.  Physical Exam:   Constitutional: Patient appears well-developed HEENT:  Head: Normocephalic Eyes:EOM are normal Neck: Normal range of motion Cardiovascular: Normal rate Pulmonary/chest: Effort normal Neurologic: Patient is alert Skin: Skin is warm Psychiatric: Patient has normal mood and affect    Ortho Exam: Ortho exam demonstrates good cervical spine range of motion.  Patient has 5 out of 5 grip EPL FPL interosseous wrist flexion extension bicep triceps and deltoid strength.  Patient has no AC joint tenderness on the right left-hand  side.  Excellent rotator cuff strength isolated infraspinatus supraspinatus and subscap muscle testing.  No other masses lymphadenopathy or skin changes noted in that right shoulder girdle region.  Most of her pain and tenderness is in the infraclavicular region.  Negative Tinel's cubital tunnel in the elbow on the right.  Specialty Comments:  No specialty comments available.  Imaging: No results found.   PMFS History: There are no active problems to display for this patient.  History reviewed. No pertinent past medical history.  History reviewed. No pertinent family history.  Past Surgical History:  Procedure Laterality Date  . CESAREAN SECTION    . LAPAROSCOPY     Social History   Occupational History  . Not on file  Tobacco Use  . Smoking status: Never Smoker  .  Smokeless tobacco: Never Used  Substance and Sexual Activity  . Alcohol use: No  . Drug use: No  . Sexual activity: Not on file

## 2018-11-23 ENCOUNTER — Other Ambulatory Visit: Payer: Self-pay

## 2018-11-23 ENCOUNTER — Encounter (INDEPENDENT_AMBULATORY_CARE_PROVIDER_SITE_OTHER): Payer: Self-pay | Admitting: Orthopedic Surgery

## 2018-11-23 ENCOUNTER — Ambulatory Visit (INDEPENDENT_AMBULATORY_CARE_PROVIDER_SITE_OTHER): Payer: 59 | Admitting: Orthopedic Surgery

## 2018-11-23 DIAGNOSIS — M25511 Pain in right shoulder: Secondary | ICD-10-CM | POA: Diagnosis not present

## 2018-11-23 NOTE — Progress Notes (Signed)
   Office Visit Note   Patient: Pamela Dawson           Date of Birth: Jan 25, 1976           MRN: [id number] Visit Date: 11/23/2018 Requested by: No referring provider defined for this encounter. PCP: System, Pcp Not In  Subjective: Chief Complaint  Patient presents with  . Right Shoulder - Follow-up    HPI: Pamela Dawson is a patient with continued right shoulder pain.  Has pain and mechanical symptoms with overhead throwing.  Her softball season has been postponed but she does report pain localizing to the anterior aspect of the right shoulder.  Last clinic visit I thought there was possibly involvement of the brachial plexus or thoracic outlet.  Currently though her radicular symptoms have resolved and she is left with primarily anterior symptoms.  Overhead throwing is more painful for her than underhand throwing.              ROS: All systems reviewed are negative as they relate to the chief complaint within the history of present illness.  Patient denies  fevers or chills.   Assessment & Plan: Visit Diagnoses:  1. Right shoulder pain, unspecified chronicity     Plan: Impression is right shoulder pain possible superior anterior labral pathology.  Rotator cuff strength looks good.  No evidence of frozen shoulder.  Plan MRI arthrogram to evaluate that superior labral tear and I will follow-up after that.  Follow-Up Instructions: Return for after MRI.   Orders:  Orders Placed This Encounter  Procedures  . MR SHOULDER RIGHT W CONTRAST  . Arthrogram   No orders of the defined types were placed in this encounter.     Procedures: No procedures performed   Clinical Data: No additional findings.  Objective: Vital Signs: There were no vitals taken for this visit.  Physical Exam:   Constitutional: Patient appears well-developed HEENT:  Head: Normocephalic Eyes:EOM are normal Neck: Normal range of motion Cardiovascular: Normal rate Pulmonary/chest: Effort normal Neurologic:  Patient is alert Skin: Skin is warm Psychiatric: Patient has normal mood and affect    Ortho Exam: Ortho exam demonstrates full active and passive range of motion of that right shoulder with positive O'Brien's testing on the right negative on the left.  No restriction of external rotation 15 degrees of abduction no discrete AC joint tenderness to direct palpation.  Rotator cuff strength is excellent on the right to infraspinatus supraspinatus and subscap muscle testing.  She does have a little bit of coarseness just lateral to the coracoid process with internal/external rotation at 90 degrees of abduction.  Specialty Comments:  No specialty comments available.  Imaging: No results found.   PMFS History: There are no active problems to display for this patient.  History reviewed. No pertinent past medical history.  History reviewed. No pertinent family history.  Past Surgical History:  Procedure Laterality Date  . CESAREAN SECTION    . LAPAROSCOPY     Social History   Occupational History  . Not on file  Tobacco Use  . Smoking status: Never Smoker  . Smokeless tobacco: Never Used  Substance and Sexual Activity  . Alcohol use: No  . Drug use: No  . Sexual activity: Not on file

## 2018-11-27 ENCOUNTER — Ambulatory Visit (INDEPENDENT_AMBULATORY_CARE_PROVIDER_SITE_OTHER): Payer: 59 | Admitting: Orthopedic Surgery

## 2018-12-27 ENCOUNTER — Telehealth (INDEPENDENT_AMBULATORY_CARE_PROVIDER_SITE_OTHER): Payer: Self-pay

## 2018-12-27 NOTE — Telephone Encounter (Signed)
I called patient advised her insurance denied scan. She will work on HEP to increase strengthening in her arm.  And will call us back in a month to try to get it approved at that point after trying conservative care.

## 2018-12-28 NOTE — Telephone Encounter (Signed)
noted 

## 2019-02-01 ENCOUNTER — Inpatient Hospital Stay: Admission: RE | Admit: 2019-02-01 | Payer: 59 | Source: Ambulatory Visit

## 2019-02-01 ENCOUNTER — Other Ambulatory Visit: Payer: 59

## 2019-02-08 ENCOUNTER — Encounter: Payer: Self-pay | Admitting: *Deleted

## 2019-07-04 ENCOUNTER — Encounter (HOSPITAL_BASED_OUTPATIENT_CLINIC_OR_DEPARTMENT_OTHER): Payer: Self-pay

## 2019-07-04 ENCOUNTER — Emergency Department (HOSPITAL_BASED_OUTPATIENT_CLINIC_OR_DEPARTMENT_OTHER)
Admission: EM | Admit: 2019-07-04 | Discharge: 2019-07-04 | Disposition: A | Discharge status: Home / Self Care | Payer: 59 | Attending: Emergency Medicine | Admitting: Emergency Medicine

## 2019-07-04 ENCOUNTER — Emergency Department (HOSPITAL_BASED_OUTPATIENT_CLINIC_OR_DEPARTMENT_OTHER): Payer: 59

## 2019-07-04 ENCOUNTER — Other Ambulatory Visit: Payer: Self-pay

## 2019-07-04 DIAGNOSIS — Z79899 Other long term (current) drug therapy: Secondary | ICD-10-CM | POA: Diagnosis not present

## 2019-07-04 DIAGNOSIS — Y999 Unspecified external cause status: Secondary | ICD-10-CM | POA: Diagnosis not present

## 2019-07-04 DIAGNOSIS — M79662 Pain in left lower leg: Secondary | ICD-10-CM

## 2019-07-04 DIAGNOSIS — X500XXA Overexertion from strenuous movement or load, initial encounter: Secondary | ICD-10-CM | POA: Insufficient documentation

## 2019-07-04 DIAGNOSIS — Y9389 Activity, other specified: Secondary | ICD-10-CM | POA: Diagnosis not present

## 2019-07-04 DIAGNOSIS — Y929 Unspecified place or not applicable: Secondary | ICD-10-CM | POA: Insufficient documentation

## 2019-07-04 DIAGNOSIS — M79605 Pain in left leg: Secondary | ICD-10-CM | POA: Insufficient documentation

## 2019-07-04 MED ORDER — OXYCODONE-ACETAMINOPHEN 5-325 MG PO TABS: 1.0000 | ORAL_TABLET | Freq: Four times a day (QID) | ORAL | 0 refills | Status: DC | PRN

## 2019-07-04 MED ORDER — OXYCODONE-ACETAMINOPHEN 5-325 MG PO TABS
2.0000 | ORAL_TABLET | Freq: Once | ORAL | Status: AC
  Administered 2019-07-04: 2 via ORAL
  Filled 2019-07-04: qty 2

## 2019-07-04 NOTE — ED Triage Notes (Signed)
Pt c/o injury/pop to left calf/heel area ~1 hour PTA-unable to flex foot-NAD-to triage on crutches

## 2019-07-04 NOTE — Discharge Instructions (Addendum)
As we discussed today I suspect you tore at least one ligament or tendon in your leg.  Please do not put weight on your affected leg.  You may remove the boot to shower however do not put weight on your leg if possible.    Please take Ibuprofen (Advil, motrin) and Tylenol (acetaminophen) to relieve your pain.  You may take up to 600 MG (3 pills) of normal strength ibuprofen every 8 hours as needed.  In between doses of ibuprofen you make take tylenol, up to 1,000 mg (two extra strength pills).  Do not take more than 3,000 mg tylenol in a 24 hour period.  Please check all medication labels as many medications such as pain and cold medications may contain tylenol.  Do not drink alcohol while taking these medications.  Do not take other NSAID'S while taking ibuprofen (such as aleve or naproxen).  Please take ibuprofen with food to decrease stomach upset.  Today you received medications that may make you sleepy or impair your ability to make decisions.  For the next 24 hours please do not drive, operate heavy machinery, care for a small child with out another adult present, or perform any activities that may cause harm to you or someone else if you were to fall asleep or be impaired.   You are being prescribed a medication which may make you sleepy. Please follow up of listed precautions for at least 24 hours after taking one dose.

## 2019-07-04 NOTE — ED Provider Notes (Signed)
[facility] DEPARTMENT Provider Note   CSN: [id number] Arrival date & time: 07/04/19  2039     History   Chief Complaint Chief Complaint  Patient presents with  . Leg Injury    HPI Pamela Dawson is a 43 y.o. female with no significant past medical history who presents today for evaluation of sudden onset of left calf pain.  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  She has been unable to bear weight since.  She denies any recent antibiotic use specifically no fluoroquinolone use.  She has never had a tendon or ligament rupture like this before.  She denies any fevers.  She does state that this area has been slightly sore over the past 2 to 3 days.  She reports significant amount of swelling.     HPI  History reviewed. No pertinent past medical history.  There are no active problems to display for this patient.   Past Surgical History:  Procedure Laterality Date  . CESAREAN SECTION    . LAPAROSCOPY       OB History   No obstetric history on file.      Home Medications    Prior to Admission medications   Medication Sig Start Date End Date Taking? Authorizing Provider  acetaminophen-codeine (TYLENOL #3) 300-30 MG tablet 1 po q hs prn pain 10/25/18   Meredith Pel, MD  amLODipine (NORVASC) 5 MG tablet Take 5 mg by mouth daily. 09/07/17   [provider]  Ibuprofen-Famotidine (DUEXIS) 800-26.6 MG TABS 1 po bid prn 10/25/18   Meredith Pel, MD  methocarbamol (ROBAXIN) 500 MG tablet 1 po q hs prn spasm 10/25/18   Meredith Pel, MD  oxyCODONE-acetaminophen (PERCOCET/ROXICET) 5-325 MG tablet Take 1 tablet by mouth every 6 (six) hours as needed for severe pain. 07/04/19   Lorin Glass, PA-C    Family History No family history on file.  Social History Social History   Tobacco Use  . Smoking status: Never Smoker  . Smokeless tobacco:  Never Used  Substance Use Topics  . Alcohol use: No  . Drug use: No     Allergies   Divalproex sodium   Review of Systems Review of Systems  Constitutional: Negative for chills and fever.  Musculoskeletal: Negative for back pain and neck pain.       Left calf pain and swelling  Skin: Negative for color change and wound.  Neurological: Negative for numbness.  All other systems reviewed and are negative.    Physical Exam Updated Vital Signs BP (!) 143/103 (BP Location: Left Arm)   Pulse 86   Temp 98.3 F (36.8 C) (Oral)   Resp 18   Ht 5\' 4"  (1.626 m)   Wt 89.8 kg   LMP 06/18/2019   SpO2 98%   BMI 33.99 kg/m   Physical Exam Vitals signs and nursing note reviewed.  Constitutional:      General: She is not in acute distress.    Appearance: She is not ill-appearing.  HENT:     Head: Normocephalic.  Cardiovascular:     Rate and Rhythm: Normal rate.     Comments: 2+ left DP/PT pulse.  Brisk, under 2 seconds, capillary refill to toes of left foot. Pulmonary:     Effort: Pulmonary effort is normal. No respiratory distress.  Musculoskeletal:     Comments:  Left calf is obviously swollen when compared to right.  On visual and tactile exam the left Achilles tendon appears at least partially intact, unable to perform Bullhead test secondary to pain. Compartments in the distal and proximal left legs are soft and easily compressible.  There is significant pain, swelling, and tenderness in the mid left calf.  There is no pain in the anterior left lower leg or tenderness to palpation. Range of motion of left knee, ankle, and hip is limited secondary to pain in the left calf.  Skin:    Comments: Mild developing ecchymosis over the left posterior calf without laceration, wound, abnormal erythema or induration.  Neurological:     Mental Status: She is alert.     Comments: Sensation intact to bilateral feet to light touch.      ED Treatments / Results  Labs (all labs ordered  are listed, but only abnormal results are displayed) Labs Reviewed - No data to display  EKG None  Radiology Dg Tibia/fibula Left  Result Date: 07/04/2019 CLINICAL DATA:  Calf pain EXAM: LEFT TIBIA AND FIBULA - 2 VIEW COMPARISON:  None. FINDINGS: There is no evidence of fracture or other focal bone lesions. Soft tissues are unremarkable. IMPRESSION: Negative. Electronically Signed   By: Jasmine Pang M.D.   On: 07/04/2019 22:05    Procedures Procedures (including critical care time)  Medications Ordered in ED Medications  oxyCODONE-acetaminophen (PERCOCET/ROXICET) 5-325 MG per tablet 2 tablet (2 tablets Oral Given 07/04/19 2140)     Initial Impression / Assessment and Plan / ED Course  I have reviewed the triage vital signs and the nursing notes.  Pertinent labs & imaging results that were available during my care of the patient were reviewed by me and considered in my medical decision making (see chart for details).       Patient presents today for evaluation of sudden onset pain in her left calf when she was pushing off with her leg during exercise.  On exam her left calf compartments are soft and easily compressible however she does have significant tenderness in the left mid calf.  X-rays were obtained without evidence of acute abnormality.  No skin cause for her pain.  I suspect her Achilles tendon is intact by palpation, however I suspect that she has ruptured either a muscle or ligament in her calf as she is in pain and unable to bear weight.  Do not suspect compartment syndrome at this time.  Patient is reportedly well-known to Dr. August Saucer with [facility] orthopedics.  She will call him in the morning to schedule a follow-up appointment.  Her pain was treated in the emergency room with oxycodone, [facility] PMP is consulted and she is given additional prescription for oxycodone.  Her blood pressure was elevated, however in the setting of pain we will not start treatment at this  time.  She appeared neurovascularly intact on exam.  She is given a cam walker, Ace wrap, and already has crutches.  She is educated on rice, OTC pain meds with appropriate Tylenol dosing given oxycodone, and signs of compartment syndrome.  Patient appears reliable for close outpatient follow-up.  Return precautions were discussed with patient who states their understanding.  At the time of discharge patient denied any unaddressed complaints or concerns.  Patient is agreeable for discharge home.   Final Clinical Impressions(s) / ED Diagnoses   Final diagnoses:  Pain of left calf    ED Discharge Orders  Ordered    oxyCODONE-acetaminophen (PERCOCET/ROXICET) 5-325 MG tablet  Every 6 hours PRN     07/04/19 2259           Cristina GongHammond, Cailen Mihalik W, PA-C 07/04/19 2334    Melene PlanFloyd, Dan, DO 07/05/19 [phone]

## 2019-07-06 ENCOUNTER — Other Ambulatory Visit: Payer: Self-pay

## 2019-07-06 ENCOUNTER — Ambulatory Visit: Payer: 59 | Admitting: Orthopaedic Surgery

## 2019-07-06 DIAGNOSIS — S86812A Strain of other muscle(s) and tendon(s) at lower leg level, left leg, initial encounter: Secondary | ICD-10-CM | POA: Diagnosis not present

## 2019-07-06 NOTE — Progress Notes (Signed)
Office Visit Note   Patient: Pamela Dawson           Date of Birth: 09-15-1975           MRN: [id number] Visit Date: 07/06/2019              Requested by: No referring provider defined for this encounter. PCP: System, Pcp Not In   Assessment & Plan: Visit Diagnoses:  1. Strain of left calf muscle     Plan: Impression is left gastroc and plantaris tear.  We will immobilized in a cam boot for now.  Desk duty for 2 months.  She works as a Engineer, structural.  We will recheck her in about 6 weeks with a plan to start PT at that time.  Follow-Up Instructions: Return in about 6 weeks (around 08/17/2019).   Orders:  No orders of the defined types were placed in this encounter.  No orders of the defined types were placed in this encounter.     Procedures: No procedures performed   Clinical Data: No additional findings.   Subjective: Chief Complaint  Patient presents with  . 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  She states that she was demonstrating a pitching maneuver and she felt 2 pops in her left calf.  She was evaluated initially in the ER and x-rays were negative.  She is placed in a cam boot and crutches and she follows up today.  Denies any numbness and tingling.   Review of Systems  Constitutional: Negative.   HENT: Negative.   Eyes: Negative.   Respiratory: Negative.   Cardiovascular: Negative.   Endocrine: Negative.   Musculoskeletal: Negative.   Neurological: Negative.   Hematological: Negative.   Psychiatric/Behavioral: Negative.   All other systems reviewed and are negative.    Objective: Vital Signs: LMP 06/18/2019   Physical Exam Vitals signs and nursing note reviewed.  Constitutional:      Appearance: She is well-developed.  Pulmonary:     Effort: Pulmonary effort is normal.  Skin:    General: Skin is  warm.     Capillary Refill: Capillary refill takes less than 2 seconds.  Neurological:     Mental Status: She is alert and oriented to person, place, and time.  Psychiatric:        Behavior: Behavior normal.        Thought Content: Thought content normal.        Judgment: Judgment normal.     Ortho Exam Left calf exam shows no significant swelling or bruising.  She does have tenderness over the medial gastroc muscle belly as well as myotendinous junction as well as the plantaris muscle.  The Achilles is continuous and Thompson test is normal. Specialty Comments:  No specialty comments available.  Imaging: No results found.   PMFS History: Patient Active Problem List   Diagnosis Date Noted  . Strain of left calf muscle 07/06/2019   No past medical history on file.  No family history on file.  Past Surgical History:  Procedure Laterality Date  . CESAREAN SECTION    . LAPAROSCOPY     Social History   Occupational History  . Not on file  Tobacco Use  . Smoking status: Never Smoker  . Smokeless tobacco: Never Used  Substance and Sexual Activity  .  Alcohol use: No  . Drug use: No  . Sexual activity: Not on file

## 2019-08-17 ENCOUNTER — Other Ambulatory Visit: Payer: Self-pay

## 2019-08-17 ENCOUNTER — Ambulatory Visit: Payer: 59 | Admitting: Orthopaedic Surgery

## 2019-08-17 DIAGNOSIS — S86812A Strain of other muscle(s) and tendon(s) at lower leg level, left leg, initial encounter: Secondary | ICD-10-CM | POA: Diagnosis not present

## 2019-08-17 NOTE — Progress Notes (Signed)
   Office Visit Note   Patient: Pamela Dawson           Date of Birth: 1976-05-10           MRN: [id number] Visit Date: 08/17/2019              Requested by: No referring provider defined for this encounter. PCP: System, Pcp Not In   Assessment & Plan: Visit Diagnoses: No diagnosis found.  Plan: Impression is 6 weeks status post left calf tear.  At this point we will order an outpatient physical therapy for strengthening.  She can discontinue the cam boot.  She may return back to work without restrictions.  Follow-up as needed.  Follow-Up Instructions: No follow-ups on file.   Orders:  No orders of the defined types were placed in this encounter.  No orders of the defined types were placed in this encounter.     Procedures: No procedures performed   Clinical Data: No additional findings.   Subjective: Chief Complaint  Patient presents with  . Left Leg - Pain    [name] returns today for her calf tear.  She is doing much better and no longer taking anything for pain.   Review of Systems   Objective: Vital Signs: There were no vitals taken for this visit.  Physical Exam  Ortho Exam Left calf exam shows no tenderness palpation.  No swelling.  She has good pushoff strength in range of motion. Specialty Comments:  No specialty comments available.  Imaging: No results found.   PMFS History: Patient Active Problem List   Diagnosis Date Noted  . Strain of left calf muscle 07/06/2019   No past medical history on file.  No family history on file.  Past Surgical History:  Procedure Laterality Date  . CESAREAN SECTION    . LAPAROSCOPY     Social History   Occupational History  . Not on file  Tobacco Use  . Smoking status: Never Smoker  . Smokeless tobacco: Never Used  Substance and Sexual Activity  . Alcohol use: No  . Drug use: No  . Sexual activity: Not on file

## 2020-03-07 DIAGNOSIS — I10 Essential (primary) hypertension: Secondary | ICD-10-CM | POA: Diagnosis not present

## 2020-05-14 DIAGNOSIS — S022XXA Fracture of nasal bones, initial encounter for closed fracture: Secondary | ICD-10-CM | POA: Diagnosis not present

## 2020-05-14 DIAGNOSIS — R519 Headache, unspecified: Secondary | ICD-10-CM | POA: Diagnosis not present

## 2020-08-29 DIAGNOSIS — J029 Acute pharyngitis, unspecified: Secondary | ICD-10-CM | POA: Diagnosis not present

## 2020-08-29 DIAGNOSIS — Z1152 Encounter for screening for COVID-19: Secondary | ICD-10-CM | POA: Diagnosis not present

## 2020-11-03 DIAGNOSIS — M722 Plantar fascial fibromatosis: Secondary | ICD-10-CM | POA: Diagnosis not present

## 2020-11-19 DIAGNOSIS — M722 Plantar fascial fibromatosis: Secondary | ICD-10-CM | POA: Diagnosis not present

## 2020-11-19 DIAGNOSIS — R52 Pain, unspecified: Secondary | ICD-10-CM | POA: Diagnosis not present

## 2020-11-21 DIAGNOSIS — R52 Pain, unspecified: Secondary | ICD-10-CM | POA: Diagnosis not present

## 2020-11-21 DIAGNOSIS — M722 Plantar fascial fibromatosis: Secondary | ICD-10-CM | POA: Diagnosis not present

## 2020-11-24 DIAGNOSIS — M722 Plantar fascial fibromatosis: Secondary | ICD-10-CM | POA: Diagnosis not present

## 2020-11-24 DIAGNOSIS — R52 Pain, unspecified: Secondary | ICD-10-CM | POA: Diagnosis not present

## 2020-11-26 DIAGNOSIS — R52 Pain, unspecified: Secondary | ICD-10-CM | POA: Diagnosis not present

## 2020-11-26 DIAGNOSIS — M722 Plantar fascial fibromatosis: Secondary | ICD-10-CM | POA: Diagnosis not present

## 2020-12-02 DIAGNOSIS — M722 Plantar fascial fibromatosis: Secondary | ICD-10-CM | POA: Diagnosis not present

## 2020-12-02 DIAGNOSIS — R52 Pain, unspecified: Secondary | ICD-10-CM | POA: Diagnosis not present

## 2020-12-04 DIAGNOSIS — R52 Pain, unspecified: Secondary | ICD-10-CM | POA: Diagnosis not present

## 2020-12-04 DIAGNOSIS — M722 Plantar fascial fibromatosis: Secondary | ICD-10-CM | POA: Diagnosis not present

## 2020-12-08 DIAGNOSIS — R52 Pain, unspecified: Secondary | ICD-10-CM | POA: Diagnosis not present

## 2020-12-08 DIAGNOSIS — M722 Plantar fascial fibromatosis: Secondary | ICD-10-CM | POA: Diagnosis not present

## 2020-12-12 DIAGNOSIS — R52 Pain, unspecified: Secondary | ICD-10-CM | POA: Diagnosis not present

## 2020-12-12 DIAGNOSIS — M722 Plantar fascial fibromatosis: Secondary | ICD-10-CM | POA: Diagnosis not present

## 2020-12-18 DIAGNOSIS — M722 Plantar fascial fibromatosis: Secondary | ICD-10-CM | POA: Diagnosis not present

## 2020-12-18 DIAGNOSIS — R52 Pain, unspecified: Secondary | ICD-10-CM | POA: Diagnosis not present

## 2020-12-23 DIAGNOSIS — R52 Pain, unspecified: Secondary | ICD-10-CM | POA: Diagnosis not present

## 2020-12-23 DIAGNOSIS — M722 Plantar fascial fibromatosis: Secondary | ICD-10-CM | POA: Diagnosis not present

## 2021-01-02 DIAGNOSIS — M722 Plantar fascial fibromatosis: Secondary | ICD-10-CM | POA: Diagnosis not present

## 2021-01-02 DIAGNOSIS — R52 Pain, unspecified: Secondary | ICD-10-CM | POA: Diagnosis not present

## 2021-01-06 DIAGNOSIS — M722 Plantar fascial fibromatosis: Secondary | ICD-10-CM | POA: Diagnosis not present

## 2021-01-06 DIAGNOSIS — R52 Pain, unspecified: Secondary | ICD-10-CM | POA: Diagnosis not present

## 2021-01-14 DIAGNOSIS — M722 Plantar fascial fibromatosis: Secondary | ICD-10-CM | POA: Diagnosis not present

## 2021-01-14 DIAGNOSIS — R52 Pain, unspecified: Secondary | ICD-10-CM | POA: Diagnosis not present

## 2021-01-20 DIAGNOSIS — R52 Pain, unspecified: Secondary | ICD-10-CM | POA: Diagnosis not present

## 2021-01-20 DIAGNOSIS — M722 Plantar fascial fibromatosis: Secondary | ICD-10-CM | POA: Diagnosis not present

## 2021-01-29 DIAGNOSIS — M722 Plantar fascial fibromatosis: Secondary | ICD-10-CM | POA: Diagnosis not present

## 2021-08-12 DIAGNOSIS — E559 Vitamin D deficiency, unspecified: Secondary | ICD-10-CM | POA: Diagnosis not present

## 2021-08-12 DIAGNOSIS — Z0001 Encounter for general adult medical examination with abnormal findings: Secondary | ICD-10-CM | POA: Diagnosis not present

## 2021-08-12 DIAGNOSIS — Z13220 Encounter for screening for lipoid disorders: Secondary | ICD-10-CM | POA: Diagnosis not present

## 2021-08-12 DIAGNOSIS — Z0000 Encounter for general adult medical examination without abnormal findings: Secondary | ICD-10-CM | POA: Diagnosis not present

## 2021-08-13 DIAGNOSIS — R0789 Other chest pain: Secondary | ICD-10-CM | POA: Diagnosis not present

## 2021-08-13 DIAGNOSIS — B351 Tinea unguium: Secondary | ICD-10-CM | POA: Diagnosis not present

## 2021-08-13 DIAGNOSIS — F5101 Primary insomnia: Secondary | ICD-10-CM | POA: Diagnosis not present

## 2021-08-19 DIAGNOSIS — R079 Chest pain, unspecified: Secondary | ICD-10-CM | POA: Diagnosis not present

## 2021-08-19 DIAGNOSIS — I10 Essential (primary) hypertension: Secondary | ICD-10-CM | POA: Diagnosis not present

## 2021-08-19 DIAGNOSIS — Z8249 Family history of ischemic heart disease and other diseases of the circulatory system: Secondary | ICD-10-CM | POA: Diagnosis not present

## 2021-08-19 DIAGNOSIS — E785 Hyperlipidemia, unspecified: Secondary | ICD-10-CM | POA: Diagnosis not present

## 2021-08-19 DIAGNOSIS — M25572 Pain in left ankle and joints of left foot: Secondary | ICD-10-CM | POA: Diagnosis not present

## 2021-09-29 DIAGNOSIS — I10 Essential (primary) hypertension: Secondary | ICD-10-CM | POA: Diagnosis not present

## 2021-09-29 DIAGNOSIS — R079 Chest pain, unspecified: Secondary | ICD-10-CM | POA: Diagnosis not present

## 2023-06-20 ENCOUNTER — Encounter: Payer: Self-pay | Admitting: Gastroenterology

## 2023-07-05 ENCOUNTER — Ambulatory Visit (AMBULATORY_SURGERY_CENTER): Payer: BC Managed Care – PPO

## 2023-07-05 VITALS — Ht 64.0 in | Wt 198.0 lb

## 2023-07-05 DIAGNOSIS — Z860100 Personal history of colon polyps, unspecified: Secondary | ICD-10-CM

## 2023-07-05 MED ORDER — NA SULFATE-K SULFATE-MG SULF 17.5-3.13-1.6 GM/177ML PO SOLN: 1.0000 | Freq: Once | ORAL | 0 refills | Status: AC

## 2023-07-05 NOTE — Progress Notes (Signed)
No egg or soy allergy known to patient   No issues known to pt with past sedation with any surgeries or procedures  Patient denies ever being told they had issues or difficulty with intubation   No FH of Malignant Hyperthermia Pt is not on diet pills  Pt is not on  home 02   Pt is not on blood thinners   Pt denies issues with constipation   No A fib or A flutter Have any cardiac testing pending--no  Pt instructed to use Singlecare.com or GoodRx for a price reduction on prep   Virtual previsit  Patient's chart reviewed by Cathlyn Parsons CRNA prior to previsit and patient appropriate for the [facility].  Previsit completed and red dot placed by patient's name on their procedure day (on provider's schedule).

## 2023-07-14 ENCOUNTER — Encounter: Payer: Self-pay | Admitting: Gastroenterology

## 2023-07-26 ENCOUNTER — Ambulatory Visit (AMBULATORY_SURGERY_CENTER): Payer: BC Managed Care – PPO | Admitting: Gastroenterology

## 2023-07-26 ENCOUNTER — Encounter: Payer: Self-pay | Admitting: Gastroenterology

## 2023-07-26 VITALS — BP 103/64 | HR 58 | Temp 97.8°F | Resp 15 | Ht 64.0 in | Wt 198.0 lb

## 2023-07-26 DIAGNOSIS — K644 Residual hemorrhoidal skin tags: Secondary | ICD-10-CM | POA: Diagnosis not present

## 2023-07-26 DIAGNOSIS — Z1211 Encounter for screening for malignant neoplasm of colon: Secondary | ICD-10-CM

## 2023-07-26 DIAGNOSIS — Z860100 Personal history of colon polyps, unspecified: Secondary | ICD-10-CM

## 2023-07-26 DIAGNOSIS — K641 Second degree hemorrhoids: Secondary | ICD-10-CM

## 2023-07-26 DIAGNOSIS — K6289 Other specified diseases of anus and rectum: Secondary | ICD-10-CM

## 2023-07-26 MED ORDER — SODIUM CHLORIDE 0.9 % IV SOLN: 500.0000 mL | INTRAVENOUS | Status: DC

## 2023-07-26 NOTE — Patient Instructions (Signed)
Thank you for letting us take care of your healthcare needs today. Please see handouts given to you on Hemorrhoids.    YOU HAD AN ENDOSCOPIC PROCEDURE TODAY AT THE [facility]:   Refer to the procedure report that was given to you for any specific questions about what was found during the examination.  If the procedure report does not answer your questions, please call your gastroenterologist to clarify.  If you requested that your care partner not be given the details of your procedure findings, then the procedure report has been included in a sealed envelope for you to review at your convenience later.  YOU SHOULD EXPECT: Some feelings of bloating in the abdomen. Passage of more gas than usual.  Walking can help get rid of the air that was put into your GI tract during the procedure and reduce the bloating. If you had a lower endoscopy (such as a colonoscopy or flexible sigmoidoscopy) you may notice spotting of blood in your stool or on the toilet paper. If you underwent a bowel prep for your procedure, you may not have a normal bowel movement for a few days.  Please Note:  You might notice some irritation and congestion in your nose or some drainage.  This is from the oxygen used during your procedure.  There is no need for concern and it should clear up in a day or so.  SYMPTOMS TO REPORT IMMEDIATELY:  Following lower endoscopy (colonoscopy or flexible sigmoidoscopy):  Excessive amounts of blood in the stool  Significant tenderness or worsening of abdominal pains  Swelling of the abdomen that is new, acute  Fever of 100F or higher   For urgent or emergent issues, a gastroenterologist can be reached at any hour by calling [phone]. Do not use MyChart messaging for urgent concerns.    DIET:  We do recommend a small meal at first, but then you may proceed to your regular diet.  Drink plenty of fluids but you should avoid alcoholic beverages for 24 hours.  ACTIVITY:  You  should plan to take it easy for the rest of today and you should NOT DRIVE or use heavy machinery until tomorrow (because of the sedation medicines used during the test).    FOLLOW UP: Our staff will call the number listed on your records the next business day following your procedure.  We will call around 7:15- 8:00 am to check on you and address any questions or concerns that you may have regarding the information given to you following your procedure. If we do not reach you, we will leave a message.     If any biopsies were taken you will be contacted by phone or by letter within the next 1-3 weeks.  Please call us at [phone] if you have not heard about the biopsies in 3 weeks.    SIGNATURES/CONFIDENTIALITY: You and/or your care partner have signed paperwork which will be entered into your electronic medical record.  These signatures attest to the fact that that the information above on your After Visit Summary has been reviewed and is understood.  Full responsibility of the confidentiality of this discharge information lies with you and/or your care-partner.

## 2023-07-26 NOTE — Op Note (Signed)
[facility] Patient Name: Pamela Dawson Procedure Date: 07/26/2023 10:21 AM MRN: [id number] Endoscopist: Corliss Parish , MD, [id number] Age: 47 Referring MD:  Date of Birth: [birth year] Gender: Female Account #: [id number] Procedure:                Colonoscopy Indications:              Screening for colorectal malignant neoplasm Medicines:                Monitored Anesthesia Care Procedure:                Pre-Anesthesia Assessment:                           - Prior to the procedure, a History and Physical                            was performed, and patient medications and                            allergies were reviewed. The patient's tolerance of                            previous anesthesia was also reviewed. The risks                            and benefits of the procedure and the sedation                            options and risks were discussed with the patient.                            All questions were answered, and informed consent                            was obtained. Prior Anticoagulants: The patient has                            taken no anticoagulant or antiplatelet agents. ASA                            Grade Assessment: II - A patient with mild systemic                            disease. After reviewing the risks and benefits,                            the patient was deemed in satisfactory condition to                            undergo the procedure.                           After obtaining informed consent, the colonoscope  was passed under direct vision. Throughout the                            procedure, the patient's blood pressure, pulse, and                            oxygen saturations were monitored continuously. The                            CF [id number] #[id number] was introduced through the anus                            and advanced to the 3 cm into the ileum. The                            colonoscopy  was performed without difficulty. The                            patient tolerated the procedure. The quality of the                            bowel preparation was good. The terminal ileum,                            ileocecal valve, appendiceal orifice, and rectum                            were photographed. Scope In: 10:27:00 AM Scope Out: 10:37:16 AM Scope Withdrawal Time: 0 hours 7 minutes 49 seconds  Total Procedure Duration: 0 hours 10 minutes 16 seconds  Findings:                 Skin tags were found on perianal exam.                           The digital rectal exam findings include                            hemorrhoids. Pertinent negatives include no                            palpable rectal lesions.                           The terminal ileum and ileocecal valve appeared                            normal.                           Normal mucosa was found in the entire colon.                           Anal papilla were hypertrophied.  Non-bleeding non-thrombosed external and internal                            hemorrhoids were found during retroflexion, during                            perianal exam and during digital exam. The                            hemorrhoids were Grade II (internal hemorrhoids                            that prolapse but reduce spontaneously). Complications:            No immediate complications. Estimated Blood Loss:     Estimated blood loss: none. Impression:               - Perianal skin tags found on perianal exam.                            Hemorrhoids found on digital rectal exam.                           - The examined portion of the ileum was normal.                           - Normal mucosa in the entire examined colon.                           - Anal papilla were hypertrophied.                           - Non-bleeding non-thrombosed external and internal                            hemorrhoids. Recommendation:            - The patient will be observed post-procedure,                            until all discharge criteria are met.                           - Discharge patient to home.                           - Patient has a contact number available for                            emergencies. The signs and symptoms of potential                            delayed complications were discussed with the                            patient. Return to normal activities tomorrow.  Written discharge instructions were provided to the                            patient.                           - High fiber diet.                           - Use FiberCon 1-2 tablets PO daily.                           - Continue present medications.                           - Repeat colonoscopy in 10 years for screening                            purposes.                           - The findings and recommendations were discussed                            with the patient.                           - The findings and recommendations were discussed                            with the patient's family. Corliss Parish, MD 07/26/2023 10:42:23 AM

## 2023-07-26 NOTE — Progress Notes (Signed)
Pt's states no medical or surgical changes since previsit or office visit. 

## 2023-07-26 NOTE — Progress Notes (Signed)
Vss nad trans to pacu 

## 2023-07-26 NOTE — Progress Notes (Signed)
GASTROENTEROLOGY PROCEDURE H&P NOTE   Primary Care Physician: Ernest Mallick, PA-C  HPI: Pamela Dawson is a 47 y.o. female who presents for Colonoscopy for screening.  Past Medical History:  Diagnosis Date   Arthritis    Hyperlipidemia    Hypertension    Past Surgical History:  Procedure Laterality Date   CESAREAN SECTION  2007   2011   LAPAROSCOPY  06/2005   Current Outpatient Medications  Medication Sig Dispense Refill   acetaminophen-codeine (TYLENOL #3) 300-30 MG tablet 1 po q hs prn pain 35 tablet 0   amoxicillin-clavulanate (AUGMENTIN) 875-125 MG tablet Take 1 tablet by mouth 2 (two) times daily.     olmesartan (BENICAR) 20 MG tablet Take 20 mg by mouth daily.     Current Facility-Administered Medications  Medication Dose Route Frequency Provider Last Rate Last Admin   0.9 %  sodium chloride infusion  500 mL Intravenous Continuous Mansouraty, Netty Starring., MD        Current Outpatient Medications:    acetaminophen-codeine (TYLENOL #3) 300-30 MG tablet, 1 po q hs prn pain, Disp: 35 tablet, Rfl: 0   amoxicillin-clavulanate (AUGMENTIN) 875-125 MG tablet, Take 1 tablet by mouth 2 (two) times daily., Disp: , Rfl:    olmesartan (BENICAR) 20 MG tablet, Take 20 mg by mouth daily., Disp: , Rfl:   Current Facility-Administered Medications:    0.9 %  sodium chloride infusion, 500 mL, Intravenous, Continuous, Mansouraty, Netty Starring., MD Allergies  Allergen Reactions   Divalproex Sodium Rash   Amlodipine Swelling    Symptoms with 10 mg dose   Valproic Acid Rash   Family History  Problem Relation Age of Onset   Colon cancer Neg Hx    Colon polyps Neg Hx    Esophageal cancer Neg Hx    Stomach cancer Neg Hx    Rectal cancer Neg Hx    Social History   Socioeconomic History   Marital status: Married    Spouse name: Not on file   Number of children: Not on file   Years of education: Not on file   Highest education level: Not on file  Occupational History   Not  on file  Tobacco Use   Smoking status: Never   Smokeless tobacco: Never  Vaping Use   Vaping status: Never Used  Substance and Sexual Activity   Alcohol use: Not Currently   Drug use: Never   Sexual activity: Yes    Birth control/protection: None  Other Topics Concern   Not on file  Social History Narrative   Not on file   Social Determinants of Health   Financial Resource Strain: Low Risk  (06/13/2023)   Received from [facility]   Overall Financial Resource Strain (CARDIA)    Difficulty of Paying Living Expenses: Not very hard  Food Insecurity: No Food Insecurity (06/13/2023)   Received from [facility]   Hunger Vital Sign    Worried About Running Out of Food in the Last Year: Never true    Ran Out of Food in the Last Year: Never true  Transportation Needs: No Transportation Needs (06/13/2023)   Received from [facility] - Transportation    Lack of Transportation (Medical): No    Lack of Transportation (Non-Medical): No  Physical Activity: Insufficiently Active (06/13/2023)   Received from [facility]   Exercise Vital Sign    Days of Exercise per Week: 3 days    Minutes of Exercise per Session: 20 min  Stress: Stress Concern Present (06/13/2023)   Received from [facility] - Occupational Stress Questionnaire    Feeling of Stress : Rather much  Social Connections: Moderately Integrated (06/13/2023)   Received from [facility]   Social Network    How would you rate your social network (family, work, friends)?: Adequate participation with social networks  Intimate Partner Violence: Not At Risk (06/13/2023)   Received from [facility]   HITS    Over the last 12 months how often did your partner physically hurt you?: Never    Over the last 12 months how often did your partner insult you or talk down to you?: Sometimes    Over the last 12 months how often did your partner threaten you with physical harm?:  Never    Over the last 12 months how often did your partner scream or curse at you?: Sometimes    Physical Exam: Today's Vitals   07/26/23 0922  BP: 124/74  Pulse: 68  Temp: 97.8 F (36.6 C)  TempSrc: Temporal  SpO2: 98%  Weight: 198 lb (89.8 kg)  Height: 5\' 4"  (1.626 m)   Body mass index is 33.99 kg/m. GEN: NAD EYE: Sclerae anicteric ENT: MMM CV: Non-tachycardic GI: Soft, NT/ND NEURO:  Alert & Oriented x 3  Lab Results: No results for input(s): "WBC", "HGB", "HCT", "PLT" in the last 72 hours. BMET No results for input(s): "NA", "K", "CL", "CO2", "GLUCOSE", "BUN", "CREATININE", "CALCIUM" in the last 72 hours. LFT No results for input(s): "PROT", "ALBUMIN", "AST", "ALT", "ALKPHOS", "BILITOT", "BILIDIR", "IBILI" in the last 72 hours. PT/INR No results for input(s): "LABPROT", "INR" in the last 72 hours.   Impression / Plan: This is a 47 y.o.female who presents for Colonoscopy for screening.  The risks and benefits of endoscopic evaluation/treatment were discussed with the patient and/or family; these include but are not limited to the risk of perforation, infection, bleeding, missed lesions, lack of diagnosis, severe illness requiring hospitalization, as well as anesthesia and sedation related illnesses.  The patient's history has been reviewed, patient examined, no change in status, and deemed stable for procedure.  The patient and/or family is agreeable to proceed.    Corliss Parish, MD [facility]oscopy Office # [phone]

## 2023-07-27 ENCOUNTER — Telehealth: Payer: Self-pay

## 2023-07-27 NOTE — Telephone Encounter (Signed)
  Follow up Call-     07/26/2023    9:22 AM  Call back number  Post procedure Call Back phone  # [phone]  Permission to leave phone message Yes     Patient questions:  Do you have a fever, pain , or abdominal swelling? No. Pain Score  0 *  Have you tolerated food without any problems? Yes.    Have you been able to return to your normal activities? Yes.    Do you have any questions about your discharge instructions: Diet   No. Medications  No. Follow up visit  No.  Do you have questions or concerns about your Care? No.  Actions: * If pain score is 4 or above: No action needed, pain <4.

## 2023-08-04 NOTE — Telephone Encounter (Signed)
Opened in error
